# Patient Record
Sex: Female | Born: 1975 | Hispanic: No | Marital: Married | Smoking: Former smoker
Health system: Southern US, Community
[De-identification: ages and names within clinical notes are randomized; demographics above are authoritative.]

## PROBLEM LIST (undated history)

## (undated) DIAGNOSIS — I639 Cerebral infarction, unspecified: Secondary | ICD-10-CM

## (undated) DIAGNOSIS — E785 Hyperlipidemia, unspecified: Secondary | ICD-10-CM

## (undated) DIAGNOSIS — E119 Type 2 diabetes mellitus without complications: Secondary | ICD-10-CM

## (undated) DIAGNOSIS — R87619 Unspecified abnormal cytological findings in specimens from cervix uteri: Secondary | ICD-10-CM

## (undated) DIAGNOSIS — C801 Malignant (primary) neoplasm, unspecified: Secondary | ICD-10-CM

## (undated) DIAGNOSIS — B019 Varicella without complication: Secondary | ICD-10-CM

## (undated) DIAGNOSIS — N83209 Unspecified ovarian cyst, unspecified side: Secondary | ICD-10-CM

## (undated) HISTORY — DX: Hyperlipidemia, unspecified: E78.5

## (undated) HISTORY — DX: Cerebral infarction, unspecified: I63.9

## (undated) HISTORY — DX: Malignant (primary) neoplasm, unspecified: C80.1

## (undated) HISTORY — PX: COSMETIC SURGERY: SHX468

## (undated) HISTORY — DX: Varicella without complication: B01.9

## (undated) HISTORY — PX: ABDOMINAL HYSTERECTOMY: SHX81

## (undated) HISTORY — DX: Type 2 diabetes mellitus without complications: E11.9

---

## 2012-10-26 HISTORY — PX: SHOULDER SURGERY: SHX246

## 2013-01-19 ENCOUNTER — Emergency Department: Payer: Self-pay | Admitting: Emergency Medicine

## 2013-01-31 ENCOUNTER — Ambulatory Visit: Payer: Self-pay | Admitting: Orthopedic Surgery

## 2013-03-15 ENCOUNTER — Ambulatory Visit: Payer: Self-pay | Admitting: Orthopedic Surgery

## 2013-03-15 LAB — URINALYSIS, COMPLETE
Glucose,UR: NEGATIVE mg/dL (ref 0–75)
Ketone: NEGATIVE
Protein: NEGATIVE
RBC,UR: 6 /HPF (ref 0–5)
Squamous Epithelial: 1
WBC UR: 1 /HPF (ref 0–5)

## 2013-03-15 LAB — BASIC METABOLIC PANEL
Anion Gap: 7 (ref 7–16)
Calcium, Total: 9 mg/dL (ref 8.5–10.1)
Chloride: 104 mmol/L (ref 98–107)
Creatinine: 0.65 mg/dL (ref 0.60–1.30)
Osmolality: 270 (ref 275–301)
Potassium: 4.1 mmol/L (ref 3.5–5.1)
Sodium: 134 mmol/L — ABNORMAL LOW (ref 136–145)

## 2013-03-15 LAB — CBC
HCT: 36.9 % (ref 35.0–47.0)
HGB: 12.5 g/dL (ref 12.0–16.0)
MCH: 32.7 pg (ref 26.0–34.0)
Platelet: 362 10*3/uL (ref 150–440)
RDW: 12.3 % (ref 11.5–14.5)
WBC: 15.3 10*3/uL — ABNORMAL HIGH (ref 3.6–11.0)

## 2013-03-15 LAB — APTT: Activated PTT: 27.9 secs (ref 23.6–35.9)

## 2013-03-15 LAB — PROTIME-INR: Prothrombin Time: 12.7 secs (ref 11.5–14.7)

## 2013-03-29 ENCOUNTER — Ambulatory Visit: Payer: Self-pay | Admitting: Orthopedic Surgery

## 2015-02-15 NOTE — Op Note (Signed)
PATIENT NAME:  Olivia Wall, Olivia Wall MR#:  [id number] DATE OF BIRTH:  [birth year]  DATE OF PROCEDURE:  03/29/2013  PREOPERATIVE DIAGNOSIS: Right shoulder superior labral anterior posterior (SLAP) tear and anterior inferior labral (Bankart) tear.   POSTOPERATIVE DIAGNOSIS: Right shoulder superior labral anterior posterior (SLAP) tear and anterior inferior labral (Bankart) tear.   PROCEDURE: Right shoulder arthroscopic SLAP and Bankart repairs.   ANESTHESIA: General with right interscalene block.   ESTIMATED BLOOD LOSS: Minimal.   COMPLICATIONS: None.   SURGEON: Thornton Park, M.D.   IMPLANTS: Ar[employer]rex Bio-SutureTak anchors x 5.   INDICATION FOR THE PROCEDURE: The patient is a 39 year old female who was seen in the Emergency Room on 01/19/2013 after an acute dislocation of the right shoulder. 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 The patient had an MR arthrogram which demonstrated an anterior inferior labral tear as well as a superior labral tear. The patient decided to proceed with surgery, as she was unhappy with the persistent instability she was experiencing. I reviewed the details of the procedure as well as the risks and benefits of surgery with the patient in my office prior to the date of surgery. She understood that the risks of surgery would include infection, bleeding, nerve or blood vessel injury, shoulder stiffness, re- tear of the cartilage, hardware failure, persistent pain, persistent instability and redislocation of the right shoulder. The patient understood that she may require further surgery if she experienced any complications. The patient understands the medical complications include DVT and pulmonary embolism, myocardial infarction, stroke, pneumonia, respiratory failure and death. The patient signed the consent form and wished to proceed with surgery.   DESCRIPTION OF PROCEDURE: The patient was met in the preoperative area. I signed the  right shoulder within the operative field according to the hospital's right site protocol. This was after verbally confirming with the patient that this was the correct site of surgery. I also confirmed this with my office notes and radiographic studies. I updated the patient's history and physical. The patient was brought to the Operating Room where she underwent a right interscalene block by the anesthesia service. She then underwent general endotracheal intubation and was positioned in a beach chair position.   Examination under anesthesia demonstrated full active range of motion. The shoulder could be subluxed to the glenoid rim, but IT could not be frankly dislocated on exam. She had no posterior instability and a negative sulcus sign.   The patient was prepped and draped in a sterile fashion. A timeout was performed to verify the patient's name, date of birth, medical record number, correct site of surgery and correct procedure to be performed. It was also used to verify the patient had received antibiotics and that all appropriate instruments, implants and radiographic studies were available in the room. Once all in attendance were in agreement, the case began.   The patient's bony landmarks were drawn out with a surgical marker along with the proposed arthroscopy incisions. These were pre-injected with 1% lidocaine plain. A #11 blade was used to establish a posterior portal. An 18-gauge spinal needle was then used to help localize the correct position for the anterior portal. A full diagnostic examination of the glenohumeral joint was undertaken. Findings on arthroscopy included a superior labral anterior posterior tear and a Bankart tear were both confirmed. The patient had intact biceps and rotator cuff. She had a non-engaging Hill-Sachs. There were no loose bodies and the subscapularis tendon was intact as well.  There were no chondral lesions of the glenoid or humeral head surfaces.   An  anterolateral portal was then established, again using an 18-gauge spinal needle for localization. A 7.25 mm arthroscopic cannula was then placed through the anterolateral portal. A 5.75 mm arthroscopic cannula was placed through the anterior portal. An arthroscopic elevator was placed through the anterolateral portal and used to mobilize the anterior inferior labrum and  capsular complex. A 4.0 resector shaver blade was then used to debride the nonarticular surface of the bony glenoid until punctate bleeding was identified. A drill guide was then placed at the inferior aspect of the anterior inferior labral tear at the articular margin. A drill hole was made and a single Bio-SutureTak anchor was then malleted into position. Traction was pulled back on the suture tails of the Bio-SutureTak anchor to ensure that it was firmly set in the glenoid bone. A 90 degree suture lasso was then placed underneath the labrum and used to shuttle a single arm of the Bio-SutureTak anchor under the labrum. An arthroscopic knot was then tied, creating a bumper of the capsular labral complex at the site of the suture anchor. Two additional anchors were then placed above this to ensure repair of the anterior labrum with appropriate tensioning of the capsule and the inferior glenohumeral ligament. Following placement of 3 anterior inferior Bio-SutureTak anchors, the attention was then turned to the superior labrum. Again, a 4.0 resector shaver blade was used to debride the superior nonarticular surface of the bony glenoid until punctate bleeding was identified. Again, a drill sleeve was placed just anterior to the biceps and a drill hole was made, allowing for placement of a Bio-SutureTak anchor. Again using the same technique, a 90 degree suture lasso was brought around the superior labrum. It was retrieved on the glenoid side of the labrum and used to pass a single limb of the suture anchor underneath the labrum. Again, an arthroscopic  knot tying technique was used to stabilize and approximate the superior labrum to the bony glenoid. Similarly, a second anchor was placed just posterior to the biceps anchor, allowing for stabilization of the SLAP lesion using the above-noted technique. Final arthroscopic images were taken of both the superior and anterior inferior labral repairs. The shoulder joint was copiously irrigated and all arthroscopic instruments were removed. A 4-0 nylon suture was used to close the 3 arthroscopic portals. A dry sterile dressing was applied along with TENS unit leads, a Polar Care sleeve and an abduction sling. The patient was then awoken and transferred to a hospital stretcher where she was brought to the PACU in stable condition. I was scrubbed and present for the entire case and all sharp and instrument counts were correct at the conclusion of the case. I spoke with the patient's family postoperatively in the consultation room to let them know that Ms. Olivia Wall was stable in the recovery room and the case had gone without complication.    ____________________________ Timoteo Gaul, MD [id number] D: 04/03/2013 16:57:46 ET T: 04/03/2013 17:19:17 ET JOB#: [id number]  cc: Timoteo Gaul, MD, <Dictator> Timoteo Gaul MD ELECTRONICALLY SIGNED 04/18/2013 18:16

## 2016-03-05 ENCOUNTER — Encounter: Payer: Self-pay | Admitting: *Deleted

## 2016-03-05 ENCOUNTER — Inpatient Hospital Stay: Admission: RE | Admit: 2016-03-05 | Payer: Self-pay | Source: Ambulatory Visit

## 2016-03-05 NOTE — Patient Instructions (Signed)
  Your procedure is scheduled on: 03-13-16 (FRIDAY) Report to [facility] To find out your arrival time please call [phone] between 1PM - 3PM on 03-12-16 (THURSDAY)  Remember: Instructions that are not followed completely may result in serious medical risk, up to and including death, or upon the discretion of your surgeon and anesthesiologist your surgery may need to be rescheduled.    _X___ 1. Do not eat food or drink liquids after midnight. No gum chewing or hard candies.     _X___ 2. No Alcohol for 24 hours before or after surgery.   ____ 3. Bring all medications with you on the day of surgery if instructed.    _X___ 4. Notify your doctor if there is any change in your medical condition     (cold, fever, infections).     Do not wear jewelry, make-up, hairpins, clips or nail polish.  Do not wear lotions, powders, or perfumes. You may wear deodorant.  Do not shave 48 hours prior to surgery. Men may shave face and neck.  Do not bring valuables to the hospital.    [facility] is not responsible for any belongings or valuables.               Contacts, dentures or bridgework may not be worn into surgery.  Leave your suitcase in the car. After surgery it may be brought to your room.  For patients admitted to the hospital, discharge time is determined by your  treatment team.   Patients discharged the day of surgery will not be allowed to drive home.   Please read over the following fact sheets that you were given:      ____ Take these medicines the morning of surgery with A SIP OF WATER:    1. NONE  2.   3.   4.  5.  6.  ____ Fleet Enema (as directed)   ____ Use CHG Soap as directed  ____ Use inhalers on the day of surgery  ____ Stop metformin 2 days prior to surgery    ____ Take 1/2 of usual insulin dose the night before surgery and none on the morning of surgery.   ____ Stop Coumadin/Plavix/aspirin-N/A  _X___ Stop  Anti-inflammatories-NO NSAIDS OR ASPIRIN PRODUCTS-TYLENOL OK TO TAKE   ____ Stop supplements until after surgery.    ____ Bring C-Pap to the hospital.

## 2016-03-11 ENCOUNTER — Encounter
Admission: RE | Admit: 2016-03-11 | Discharge: 2016-03-11 | Disposition: A | Discharge status: Home / Self Care | Payer: 59 | Source: Ambulatory Visit | Attending: Obstetrics & Gynecology | Admitting: Obstetrics & Gynecology

## 2016-03-11 DIAGNOSIS — N870 Mild cervical dysplasia: Secondary | ICD-10-CM | POA: Diagnosis present

## 2016-03-11 DIAGNOSIS — Z87891 Personal history of nicotine dependence: Secondary | ICD-10-CM | POA: Diagnosis not present

## 2016-03-11 DIAGNOSIS — Z79899 Other long term (current) drug therapy: Secondary | ICD-10-CM | POA: Diagnosis not present

## 2016-03-11 DIAGNOSIS — D060 Carcinoma in situ of endocervix: Secondary | ICD-10-CM | POA: Diagnosis not present

## 2016-03-11 DIAGNOSIS — Z820 Family history of epilepsy and other diseases of the nervous system: Secondary | ICD-10-CM | POA: Diagnosis not present

## 2016-03-11 DIAGNOSIS — Z803 Family history of malignant neoplasm of breast: Secondary | ICD-10-CM | POA: Diagnosis not present

## 2016-03-11 LAB — BASIC METABOLIC PANEL
ANION GAP: 7 (ref 5–15)
BUN: 11 mg/dL (ref 6–20)
CALCIUM: 9.6 mg/dL (ref 8.9–10.3)
CO2: 26 mmol/L (ref 22–32)
CREATININE: 0.69 mg/dL (ref 0.44–1.00)
Chloride: 102 mmol/L (ref 101–111)
GFR calc non Af Amer: >60 mL/min (ref >60)
Glucose, Bld: 132 mg/dL — ABNORMAL HIGH (ref 65–99)
Potassium: 4 mmol/L (ref 3.5–5.1)
Sodium: 135 mmol/L (ref 135–145)

## 2016-03-11 LAB — CBC
HCT: 37.8 % (ref 35.0–47.0)
Hemoglobin: 12.8 g/dL (ref 12.0–16.0)
MCH: 33 pg (ref 26.0–34.0)
MCHC: 33.8 g/dL (ref 32.0–36.0)
MCV: 97.7 fL (ref 80.0–100.0)
PLATELETS: 326 10*3/uL (ref 150–440)
RBC: 3.87 MIL/uL (ref 3.80–5.20)
RDW: 12.5 % (ref 11.5–14.5)
WBC: 10.1 10*3/uL (ref 3.6–11.0)

## 2016-03-11 LAB — TYPE AND SCREEN
ABO/RH(D): O POS
Antibody Screen: NEGATIVE

## 2016-03-11 LAB — ABO/RH: ABO/RH(D): O POS

## 2016-03-13 ENCOUNTER — Encounter
Admission: RE | Disposition: A | Discharge status: Home / Self Care | Payer: Self-pay | Source: Ambulatory Visit | Attending: Obstetrics & Gynecology

## 2016-03-13 ENCOUNTER — Ambulatory Visit: Payer: 59 | Admitting: Anesthesiology

## 2016-03-13 ENCOUNTER — Ambulatory Visit
Admission: RE | Admit: 2016-03-13 | Discharge: 2016-03-13 | Disposition: A | Discharge status: Home / Self Care | Payer: 59 | Source: Ambulatory Visit | Attending: Obstetrics & Gynecology | Admitting: Obstetrics & Gynecology

## 2016-03-13 ENCOUNTER — Encounter: Payer: Self-pay | Admitting: *Deleted

## 2016-03-13 DIAGNOSIS — Z820 Family history of epilepsy and other diseases of the nervous system: Secondary | ICD-10-CM | POA: Insufficient documentation

## 2016-03-13 DIAGNOSIS — D060 Carcinoma in situ of endocervix: Secondary | ICD-10-CM | POA: Diagnosis not present

## 2016-03-13 DIAGNOSIS — Z87891 Personal history of nicotine dependence: Secondary | ICD-10-CM | POA: Insufficient documentation

## 2016-03-13 DIAGNOSIS — Z803 Family history of malignant neoplasm of breast: Secondary | ICD-10-CM | POA: Insufficient documentation

## 2016-03-13 DIAGNOSIS — Z79899 Other long term (current) drug therapy: Secondary | ICD-10-CM | POA: Insufficient documentation

## 2016-03-13 HISTORY — DX: Unspecified ovarian cyst, unspecified side: N83.209

## 2016-03-13 HISTORY — DX: Unspecified abnormal cytological findings in specimens from cervix uteri: R87.619

## 2016-03-13 HISTORY — PX: LEEP: SHX91

## 2016-03-13 LAB — POCT PREGNANCY, URINE: PREG TEST UR: NEGATIVE

## 2016-03-13 SURGERY — LEEP (LOOP ELECTROSURGICAL EXCISION PROCEDURE)
Anesthesia: General | Wound class: Contaminated

## 2016-03-13 MED ORDER — DEXAMETHASONE SODIUM PHOSPHATE 10 MG/ML IJ SOLN
INTRAMUSCULAR | Status: DC | PRN
  Administered 2016-03-13: 5 mg via INTRAVENOUS

## 2016-03-13 MED ORDER — FERRIC SUBSULFATE 259 MG/GM EX SOLN
CUTANEOUS | Status: AC
  Filled 2016-03-13: qty 8

## 2016-03-13 MED ORDER — KETOROLAC TROMETHAMINE 30 MG/ML IJ SOLN: 30.0000 mg | Freq: Four times a day (QID) | INTRAMUSCULAR | Status: DC

## 2016-03-13 MED ORDER — LIDOCAINE HCL (CARDIAC) 20 MG/ML IV SOLN
INTRAVENOUS | Status: DC | PRN
  Administered 2016-03-13: 100 mg via INTRAVENOUS

## 2016-03-13 MED ORDER — GLYCOPYRROLATE 0.2 MG/ML IJ SOLN
INTRAMUSCULAR | Status: DC | PRN
  Administered 2016-03-13: 0.2 mg via INTRAVENOUS

## 2016-03-13 MED ORDER — ONDANSETRON HCL 4 MG/2ML IJ SOLN: 4.0000 mg | Freq: Once | INTRAMUSCULAR | Status: DC | PRN

## 2016-03-13 MED ORDER — ONDANSETRON HCL 4 MG/2ML IJ SOLN
INTRAMUSCULAR | Status: DC | PRN
  Administered 2016-03-13: 4 mg via INTRAVENOUS

## 2016-03-13 MED ORDER — MIDAZOLAM HCL 5 MG/5ML IJ SOLN
INTRAMUSCULAR | Status: DC | PRN
  Administered 2016-03-13: 2 mg via INTRAVENOUS

## 2016-03-13 MED ORDER — IODINE STRONG (LUGOLS) 5 % PO SOLN
ORAL | Status: DC | PRN
  Administered 2016-03-13: 0.2 mL via ORAL

## 2016-03-13 MED ORDER — ACETAMINOPHEN 325 MG PO TABS: 650.0000 mg | ORAL_TABLET | ORAL | Status: DC | PRN

## 2016-03-13 MED ORDER — FAMOTIDINE 20 MG PO TABS
20.0000 mg | ORAL_TABLET | Freq: Once | ORAL | Status: AC
  Administered 2016-03-13: 20 mg via ORAL

## 2016-03-13 MED ORDER — LIDOCAINE HCL (PF) 1 % IJ SOLN
INTRAMUSCULAR | Status: AC
  Filled 2016-03-13: qty 30

## 2016-03-13 MED ORDER — FERRIC SUBSULFATE 259 MG/GM EX SOLN
CUTANEOUS | Status: DC | PRN
  Administered 2016-03-13: 1

## 2016-03-13 MED ORDER — FENTANYL CITRATE (PF) 100 MCG/2ML IJ SOLN: 25.0000 ug | INTRAMUSCULAR | Status: DC | PRN

## 2016-03-13 MED ORDER — MORPHINE SULFATE (PF) 2 MG/ML IV SOLN: 1.0000 mg | INTRAVENOUS | Status: DC | PRN

## 2016-03-13 MED ORDER — PROPOFOL 10 MG/ML IV BOLUS
INTRAVENOUS | Status: DC | PRN
  Administered 2016-03-13: 150 mg via INTRAVENOUS

## 2016-03-13 MED ORDER — FAMOTIDINE 20 MG PO TABS
ORAL_TABLET | ORAL | Status: AC
  Filled 2016-03-13: qty 1

## 2016-03-13 MED ORDER — KETOROLAC TROMETHAMINE 30 MG/ML IJ SOLN
30.0000 mg | Freq: Four times a day (QID) | INTRAMUSCULAR | Status: DC
  Filled 2016-03-13 (×6): qty 1

## 2016-03-13 MED ORDER — ACETAMINOPHEN 650 MG RE SUPP: 650.0000 mg | RECTAL | Status: DC | PRN

## 2016-03-13 MED ORDER — FENTANYL CITRATE (PF) 100 MCG/2ML IJ SOLN
INTRAMUSCULAR | Status: DC | PRN
  Administered 2016-03-13 (×2): 25 ug via INTRAVENOUS
  Administered 2016-03-13: 50 ug via INTRAVENOUS

## 2016-03-13 MED ORDER — IODINE STRONG (LUGOLS) 5 % PO SOLN
ORAL | Status: AC
  Filled 2016-03-13: qty 1

## 2016-03-13 MED ORDER — LACTATED RINGERS IV SOLN
INTRAVENOUS | Status: DC
Start: 2016-03-13 — End: 2016-03-13
  Administered 2016-03-13: 14:00:00 via INTRAVENOUS

## 2016-03-13 SURGICAL SUPPLY — 33 items
APPLICATOR COTTON TIP 6IN STRL (MISCELLANEOUS) ×9 IMPLANT
BLADE SURG SZ11 CARB STEEL (BLADE) ×3 IMPLANT
CANISTER SUCT 1200ML W/VALVE (MISCELLANEOUS) ×3 IMPLANT
CATH ROBINSON RED A/P 16FR (CATHETERS) ×3 IMPLANT
CNTNR SPEC 2.5X3XGRAD LEK (MISCELLANEOUS) ×1
CONT SPEC 4OZ STER OR WHT (MISCELLANEOUS) ×2
CONTAINER SPEC 2.5X3XGRAD LEK (MISCELLANEOUS) ×1 IMPLANT
DRAPE PERI LITHO V/GYN (MISCELLANEOUS) ×3 IMPLANT
DRAPE UNDER BUTTOCK W/FLU (DRAPES) ×3 IMPLANT
ELECT LEEP BALL 5MM 12CM (MISCELLANEOUS) ×3
ELECT LEEP LOOP 20X10 R2010 (MISCELLANEOUS) ×3
ELECT LOOP 1.0X1.0CM R1010 (MISCELLANEOUS) ×3
ELECT REM PT RETURN 9FT ADLT (ELECTROSURGICAL) ×3
ELECTRODE LEEP BALL 5MM 12CM (MISCELLANEOUS) ×1 IMPLANT
ELECTRODE LEP LOOP 20X10 R2010 (MISCELLANEOUS) ×1 IMPLANT
ELECTRODE LOOP 1.0X1.0CM R1010 (MISCELLANEOUS) ×1 IMPLANT
ELECTRODE REM PT RTRN 9FT ADLT (ELECTROSURGICAL) ×1 IMPLANT
GLOVE BIOGEL PI IND STRL 6.5 (GLOVE) ×1 IMPLANT
GLOVE BIOGEL PI INDICATOR 6.5 (GLOVE) ×2
GLOVE SURG SYN 6.5 ES PF (GLOVE) ×3 IMPLANT
GOWN STRL REUS W/ TWL LRG LVL3 (GOWN DISPOSABLE) ×2 IMPLANT
GOWN STRL REUS W/TWL LRG LVL3 (GOWN DISPOSABLE) ×4
KIT RM TURNOVER CYSTO AR (KITS) ×3 IMPLANT
NEEDLE SPNL 22GX3.5 QUINCKE BK (NEEDLE) ×3 IMPLANT
NS IRRIG 500ML POUR BTL (IV SOLUTION) ×3 IMPLANT
PACK BASIN MINOR ARMC (MISCELLANEOUS) ×3 IMPLANT
PAD OB MATERNITY 4.3X12.25 (PERSONAL CARE ITEMS) ×3 IMPLANT
PAD PREP 24X41 OB/GYN DISP (PERSONAL CARE ITEMS) ×3 IMPLANT
STRAW SMOKE EVAC LEEP 6150 NON (MISCELLANEOUS) ×3 IMPLANT
SYR CONTROL 10ML (SYRINGE) ×3 IMPLANT
TOWEL OR 17X26 4PK STRL BLUE (TOWEL DISPOSABLE) ×3 IMPLANT
TUBING CONNECTING 10 (TUBING) ×2 IMPLANT
TUBING CONNECTING 10' (TUBING) ×1

## 2016-03-13 NOTE — Transfer of Care (Signed)
Immediate Anesthesia Transfer of Care Note  Patient: Olivia Wall  Procedure(s) Performed: Procedure(s): LOOP ELECTROSURGICAL EXCISION PROCEDURE (LEEP) (N/A)  Patient Location: PACU  Anesthesia Type:General  Level of Consciousness: sedated  Airway & Oxygen Therapy: Patient Spontanous Breathing and Patient connected to face mask oxygen  Post-op Assessment: Report given to RN  Post vital signs: Reviewed and stable  Last Vitals:  Filed Vitals:   03/13/16 1321 03/13/16 1637  BP: 125/75 119/80  Pulse: 79 88  Temp: 36.9 C 36.2 C  Resp: 14 19    Last Pain: There were no vitals filed for this visit.       Complications: No apparent anesthesia complications

## 2016-03-13 NOTE — Anesthesia Preprocedure Evaluation (Signed)
Anesthesia Evaluation  Patient identified by MRN, date of birth, ID band Patient awake    Reviewed: Allergy & Precautions, H&P , NPO status , Patient's Chart, lab work & pertinent test results, reviewed documented beta blocker date and time   Airway Mallampati: II  TM Distance: >3 FB Neck ROM: full    Dental  (+) Teeth Intact   Pulmonary neg pulmonary ROS, former smoker,    Pulmonary exam normal        Cardiovascular Exercise Tolerance: Good negative cardio ROS Normal cardiovascular exam Rate:Normal     Neuro/Psych negative neurological ROS  negative psych ROS   GI/Hepatic negative GI ROS, Neg liver ROS,   Endo/Other  negative endocrine ROS  Renal/GU negative Renal ROS  negative genitourinary   Musculoskeletal   Abdominal   Peds  Hematology negative hematology ROS (+)   Anesthesia Other Findings   Reproductive/Obstetrics negative OB ROS                             Anesthesia Physical Anesthesia Plan  ASA: II  Anesthesia Plan: General LMA   Post-op Pain Management:    Induction:   Airway Management Planned:   Additional Equipment:   Intra-op Plan:   Post-operative Plan:   Informed Consent: I have reviewed the patients History and Physical, chart, labs and discussed the procedure including the risks, benefits and alternatives for the proposed anesthesia with the patient or authorized representative who has indicated his/her understanding and acceptance.     Plan Discussed with: CRNA  Anesthesia Plan Comments:         Anesthesia Quick Evaluation  

## 2016-03-13 NOTE — H&P (Signed)
H&P Update  PLEASE SEE PAPER H&P  Pt was last seen in my office, and complete history and physical performed.  The surgical history has been reviewed and remains accurate without interval change. The patient was re-examined and patient's physiologic condition has not changed significantly in the last 30 days.  No new pharmacological allergies or types of therapy has been initiated.  No Known Allergies  Past Medical History  Diagnosis Date  . Abnormal Pap smear of cervix   . Ovarian cyst    Past Surgical History  Procedure Laterality Date  . Cesarean section    . Shoulder surgery Right     BP 125/75 mmHg  Pulse 79  Temp(Src) 98.4 F (36.9 C) (Oral)  Resp 14  Ht 5\' 6"  (1.676 m)  Wt 97.07 kg (214 lb)  BMI 34.56 kg/m2  SpO2 100%  LMP 03/05/2013  NAD RRR no murmurs CTAB, no wheezing, resps unlabored +BS, soft, NTTP No c/c/e Pelvic exam deferred  The above history was confirmed with the patient. The condition still exists that makes this procedure necessary. Surgical plan includes Loop Electrocautery Excision Procedure (LEEP), as confirmed on the consent. The treatment plan remains the same, without new options for care.  The patient understands the potential benefits and risks and the consents have been signed and placed on the chart.     Larey Days, MD Attending Obstetrician Gynecologist [facility]

## 2016-03-13 NOTE — Anesthesia Postprocedure Evaluation (Signed)
Anesthesia Post Note  Patient: Olivia Wall  Procedure(s) Performed: Procedure(s) (LRB): LOOP ELECTROSURGICAL EXCISION PROCEDURE (LEEP) (N/A)  Patient location during evaluation: PACU Anesthesia Type: General Level of consciousness: awake and alert Pain management: pain level controlled Vital Signs Assessment: post-procedure vital signs reviewed and stable Respiratory status: spontaneous breathing, nonlabored ventilation, respiratory function stable and patient connected to nasal cannula oxygen Cardiovascular status: blood pressure returned to baseline and stable Postop Assessment: no signs of nausea or vomiting Anesthetic complications: no    Last Vitals:  Filed Vitals:   03/13/16 1706 03/13/16 1718  BP: 114/78 116/74  Pulse: 63 83  Temp: 36.6 C 36.7 C  Resp: 17 19    Last Pain:  Filed Vitals:   03/13/16 1721  PainSc: 0-No pain                 Benedetto Ryder S

## 2016-03-13 NOTE — Anesthesia Procedure Notes (Signed)
Procedure Name: LMA Insertion Date/Time: 03/13/2016 4:07 PM Performed by: Dionne Bucy Pre-anesthesia Checklist: Patient identified, Emergency Drugs available, Suction available and Patient being monitored Patient Re-evaluated:Patient Re-evaluated prior to inductionOxygen Delivery Method: Circle system utilized Preoxygenation: Pre-oxygenation with 100% oxygen Intubation Type: IV induction Ventilation: Mask ventilation without difficulty LMA: LMA inserted LMA Size: 3.5 Number of attempts: 1 Placement Confirmation: positive ETCO2 and breath sounds checked- equal and bilateral Tube secured with: Tape Dental Injury: Teeth and Oropharynx as per pre-operative assessment

## 2016-03-13 NOTE — Op Note (Signed)
  LEEP PROCEDURE NOTE   PROCEDURE DATE: 03/13/2016  PATIENT:  Olivia Wall  40 y.o. female  PRE-OPERATIVE DIAGNOSIS:  Cervical dysplasia  POST-OPERATIVE DIAGNOSIS:  Cervical dysplasia  PROCEDURE:  Procedure(s): LOOP ELECTROSURGICAL EXCISION PROCEDURE (LEEP) (N/A)  SURGEON:  Surgeon(s) and Role:    * Emeril Stille C Heyli Min, MD - Primary  ANESTHESIA:  General via LMA  I/O  Total I/O In: -  Out: 5 [Blood:5]  FINDINGS:  Widespread lack of uptake of Lugol's solution with most extensive being at 6:00, circumferentially.  SPECIMEN: 1. LEEP cone biopsy, stitch at 12:00.  2. LEEP Right border of cervix.  3. LEEP Left border of cervix, 4. 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  Role of HPV,cervical dysplasia and need for close followup was empasized. Informed written consent was obtained. All questions were answered. Time out performed. Urine pregnancy test was negative.  Procedure: The patient was placed in lithotomy position and the bivalved coated speculum was placed in the patient's vagina. A grounding pad placed on the patient. Lugol's solution was applied to the cervix and areas of decreased uptake were noted around the transformation zone.  The suction was turned on and the large loop on 60 Watts of cutting current was used to excise the area of decreased uptake and excise the entire transformation zone.  Excellent hemostasis was achieved using roller ball coagulation set at 50 Watts coagulation current.  Astringyn solution was then applied and the speculum was removed from the vagina. Specimens were sent to pathology.  All instruments were removed.  Instrument, sponge and needle counts were correct x2.  The patient tolerated the procedure and was brought to PACU in a stable condition.   Post-operative instructions given to patient, including instruction to seek medical attention for persistent bright red bleeding, fever, abdominal/pelvic pain, dysuria, nausea or vomiting. She was also told about the possibility of having black tinged discharge for weeks. She was counseled to avoid anything in the vagina (sex/douching/tampons) for 4 weeks. She has a 4 week post-operative check to assess wound healing, review results and discuss further management.   ----- Larey Days, MD Attending Obstetrician and Gynecologist [facility]

## 2016-03-13 NOTE — Discharge Instructions (Signed)
You should expect to have some cramping and vaginal bleeding for about a week. This should taper off and subside, much like a period. If heavy bleeding continues or gets worse, you should contact the office for an earlier appointment.   Please call the office or physician on call for fever >101, severe pain, and heavy bleeding.   Herrings!!   AMBULATORY SURGERY  DISCHARGE INSTRUCTIONS   1) The drugs that you were given will stay in your system until tomorrow so for the next 24 hours you should not:  A) Drive an automobile B) Make any legal decisions C) Drink any alcoholic beverage   2) You may resume regular meals tomorrow.  Today it is better to start with liquids and gradually work up to solid foods.  You may eat anything you prefer, but it is better to start with liquids, then soup and crackers, and gradually work up to solid foods.   3) Please notify your doctor immediately if you have any unusual bleeding, trouble breathing, redness and pain at the surgery site, drainage, fever, or pain not relieved by medication.    4) Additional Instructions:        Please contact your physician with any problems or Same Day Surgery at [phone], Monday through Friday 6 am to 4 pm, or [place] at [facility] number at [phone].AMBULATORY SURGERY  DISCHARGE INSTRUCTIONS   5) The drugs that you were given will stay in your system until tomorrow so for the next 24 hours you should not:  D) Drive an automobile E) Make any legal decisions F) Drink any alcoholic beverage   6) You may resume regular meals tomorrow.  Today it is better to start with liquids and gradually work up to solid foods.  You may eat anything you prefer, but it is better to start with liquids, then soup and crackers, and gradually work up to solid foods.   7) Please notify your doctor immediately if you have any unusual bleeding, trouble breathing, redness and  pain at the surgery site, drainage, fever, or pain not relieved by medication.    8) Additional Instructions:        Please contact your physician with any problems or Same Day Surgery at [phone], Monday through Friday 6 am to 4 pm, or [place] at [facility] number at [phone].

## 2016-03-16 ENCOUNTER — Encounter: Payer: Self-pay | Admitting: Obstetrics & Gynecology

## 2016-03-17 LAB — SURGICAL PATHOLOGY

## 2016-04-24 ENCOUNTER — Encounter
Admission: RE | Admit: 2016-04-24 | Discharge: 2016-04-24 | Disposition: A | Discharge status: Home / Self Care | Payer: 59 | Source: Ambulatory Visit | Attending: Obstetrics & Gynecology | Admitting: Obstetrics & Gynecology

## 2016-04-24 DIAGNOSIS — Z01812 Encounter for preprocedural laboratory examination: Secondary | ICD-10-CM | POA: Insufficient documentation

## 2016-04-24 LAB — CBC
HCT: 37.6 % (ref 35.0–47.0)
HEMOGLOBIN: 12.8 g/dL (ref 12.0–16.0)
MCH: 33.3 pg (ref 26.0–34.0)
MCHC: 34.2 g/dL (ref 32.0–36.0)
MCV: 97.4 fL (ref 80.0–100.0)
Platelets: 319 10*3/uL (ref 150–440)
RBC: 3.86 MIL/uL (ref 3.80–5.20)
RDW: 12.7 % (ref 11.5–14.5)
WBC: 10.1 10*3/uL (ref 3.6–11.0)

## 2016-04-24 LAB — BASIC METABOLIC PANEL
Anion gap: 8 (ref 5–15)
BUN: 13 mg/dL (ref 6–20)
CHLORIDE: 103 mmol/L (ref 101–111)
CO2: 27 mmol/L (ref 22–32)
Calcium: 9.5 mg/dL (ref 8.9–10.3)
Creatinine, Ser: 0.71 mg/dL (ref 0.44–1.00)
GFR calc Af Amer: >60 mL/min (ref >60)
GFR calc non Af Amer: >60 mL/min (ref >60)
GLUCOSE: 121 mg/dL — AB (ref 65–99)
POTASSIUM: 3.6 mmol/L (ref 3.5–5.1)
Sodium: 138 mmol/L (ref 135–145)

## 2016-04-24 NOTE — Patient Instructions (Signed)
  Your procedure is scheduled on: May 07, 2016  (Thursday) Report to Same Day Surgery 2nd floor [facility] To find out your arrival time please call [phone] between 1PM - 3PM on  May 06, 2016 (Wednesday) Remember: Instructions that are not followed completely may result in serious medical risk, up to and including death, or upon the discretion of your surgeon and anesthesiologist your surgery may need to be rescheduled.    _x___ 1. Do not eat food or drink liquids after midnight. No gum chewing or hard candies.     __x__ 2. No Alcohol for 24 hours before or after surgery.   __x__3. No Smoking for 24 prior to surgery.   ____  4. Bring all medications with you on the day of surgery if instructed.    __x__ 5. Notify your doctor if there is any change in your medical condition     (cold, fever, infections).     Do not wear jewelry, make-up, hairpins, clips or nail polish.  Do not wear lotions, powders, or perfumes. You may wear deodorant.  Do not shave 48 hours prior to surgery. Men may shave face and neck.  Do not bring valuables to the hospital.    [facility] is not responsible for any belongings or valuables.               Contacts, dentures or bridgework may not be worn into surgery.  Leave your suitcase in the car. After surgery it may be brought to your room.  For patients admitted to the hospital, discharge time is determined by your treatment team.   Patients discharged the day of surgery will not be allowed to drive home.    Please read over the following fact sheets that you were given:   [facility] Preparing for Surgery and or MRSA Information   ___ Take these medicines the morning of surgery with A SIP OF WATER:    1.   2.  3.  4.  5.  6.  ____ Fleet Enema (as directed)   _x___ Use CHG Soap or sage wipes as directed on instruction sheet   ____ Use inhalers on the day of surgery and bring to hospital day of surgery  ____ Stop metformin 2 days prior to  surgery    ____ Take 1/2 of usual insulin dose the night before surgery and none on the morning of surgery           __x__ Stop aspirin or coumadin, or plavix (NO ASPIRIN )I  _x__ Stop Anti-inflammatories such as Advil, Aleve, Ibuprofen, Motrin, Naproxen,          Naprosyn, Goodies powders or aspirin products. Ok to take Tylenol.   ____ Stop supplements until after surgery.    ____ Bring C-Pap to the hospital.

## 2016-05-07 ENCOUNTER — Encounter: Payer: Self-pay | Admitting: *Deleted

## 2016-05-07 ENCOUNTER — Ambulatory Visit
Admission: RE | Admit: 2016-05-07 | Discharge: 2016-05-07 | Disposition: A | Discharge status: Home / Self Care | Payer: 59 | Source: Ambulatory Visit | Attending: Obstetrics & Gynecology | Admitting: Obstetrics & Gynecology

## 2016-05-07 ENCOUNTER — Encounter
Admission: RE | Disposition: A | Discharge status: Home / Self Care | Payer: Self-pay | Source: Ambulatory Visit | Attending: Obstetrics & Gynecology

## 2016-05-07 ENCOUNTER — Ambulatory Visit: Payer: 59 | Admitting: Anesthesiology

## 2016-05-07 DIAGNOSIS — Z793 Long term (current) use of hormonal contraceptives: Secondary | ICD-10-CM | POA: Insufficient documentation

## 2016-05-07 DIAGNOSIS — N711 Chronic inflammatory disease of uterus: Secondary | ICD-10-CM | POA: Insufficient documentation

## 2016-05-07 DIAGNOSIS — Z9889 Other specified postprocedural states: Secondary | ICD-10-CM | POA: Diagnosis not present

## 2016-05-07 DIAGNOSIS — Z803 Family history of malignant neoplasm of breast: Secondary | ICD-10-CM | POA: Insufficient documentation

## 2016-05-07 DIAGNOSIS — N879 Dysplasia of cervix uteri, unspecified: Secondary | ICD-10-CM | POA: Diagnosis present

## 2016-05-07 DIAGNOSIS — Z87891 Personal history of nicotine dependence: Secondary | ICD-10-CM | POA: Insufficient documentation

## 2016-05-07 DIAGNOSIS — N870 Mild cervical dysplasia: Secondary | ICD-10-CM | POA: Insufficient documentation

## 2016-05-07 DIAGNOSIS — Z820 Family history of epilepsy and other diseases of the nervous system: Secondary | ICD-10-CM | POA: Insufficient documentation

## 2016-05-07 DIAGNOSIS — N838 Other noninflammatory disorders of ovary, fallopian tube and broad ligament: Secondary | ICD-10-CM | POA: Insufficient documentation

## 2016-05-07 DIAGNOSIS — N72 Inflammatory disease of cervix uteri: Secondary | ICD-10-CM | POA: Diagnosis not present

## 2016-05-07 HISTORY — PX: LAPAROSCOPIC HYSTERECTOMY: SHX1926

## 2016-05-07 HISTORY — PX: LAPAROSCOPIC BILATERAL SALPINGECTOMY: SHX5889

## 2016-05-07 LAB — TYPE AND SCREEN
ABO/RH(D): O POS
Antibody Screen: NEGATIVE

## 2016-05-07 LAB — POCT PREGNANCY, URINE: PREG TEST UR: NEGATIVE

## 2016-05-07 SURGERY — HYSTERECTOMY, TOTAL, LAPAROSCOPIC
Anesthesia: General

## 2016-05-07 MED ORDER — ACETAMINOPHEN 650 MG RE SUPP
650.0000 mg | RECTAL | Status: DC | PRN
  Filled 2016-05-07: qty 1

## 2016-05-07 MED ORDER — LIDOCAINE HCL (CARDIAC) 20 MG/ML IV SOLN
INTRAVENOUS | Status: DC | PRN
  Administered 2016-05-07: 45 mg via INTRAVENOUS

## 2016-05-07 MED ORDER — FENTANYL CITRATE (PF) 100 MCG/2ML IJ SOLN
INTRAMUSCULAR | Status: DC
Start: 2016-05-07 — End: 2016-05-07
  Filled 2016-05-07: qty 2

## 2016-05-07 MED ORDER — FENTANYL CITRATE (PF) 100 MCG/2ML IJ SOLN
INTRAMUSCULAR | Status: AC
  Filled 2016-05-07: qty 2

## 2016-05-07 MED ORDER — ACETAMINOPHEN 325 MG PO TABS: 650.0000 mg | ORAL_TABLET | ORAL | Status: DC | PRN

## 2016-05-07 MED ORDER — FENTANYL CITRATE (PF) 100 MCG/2ML IJ SOLN
25.0000 ug | INTRAMUSCULAR | Status: AC | PRN
  Administered 2016-05-07 (×2): 25 ug via INTRAVENOUS

## 2016-05-07 MED ORDER — CEFAZOLIN SODIUM-DEXTROSE 2-4 GM/100ML-% IV SOLN
INTRAVENOUS | Status: AC
  Administered 2016-05-07: 2 g via INTRAVENOUS
  Filled 2016-05-07: qty 100

## 2016-05-07 MED ORDER — DEXAMETHASONE SODIUM PHOSPHATE 10 MG/ML IJ SOLN
INTRAMUSCULAR | Status: DC | PRN
  Administered 2016-05-07: 10 mg via INTRAVENOUS

## 2016-05-07 MED ORDER — FENTANYL CITRATE (PF) 100 MCG/2ML IJ SOLN
25.0000 ug | INTRAMUSCULAR | Status: AC | PRN
  Administered 2016-05-07 (×6): 25 ug via INTRAVENOUS

## 2016-05-07 MED ORDER — HEPARIN SODIUM (PORCINE) 5000 UNIT/ML IJ SOLN
5000.0000 [IU] | Freq: Once | INTRAMUSCULAR | Status: AC
  Administered 2016-05-07: 5000 [IU] via SUBCUTANEOUS

## 2016-05-07 MED ORDER — ROCURONIUM BROMIDE 100 MG/10ML IV SOLN
INTRAVENOUS | Status: DC | PRN
  Administered 2016-05-07 (×2): 10 mg via INTRAVENOUS
  Administered 2016-05-07: 20 mg via INTRAVENOUS

## 2016-05-07 MED ORDER — LACTATED RINGERS IV SOLN
INTRAVENOUS | Status: DC
  Administered 2016-05-07: 10:00:00 via INTRAVENOUS

## 2016-05-07 MED ORDER — SUCCINYLCHOLINE CHLORIDE 20 MG/ML IJ SOLN
INTRAMUSCULAR | Status: DC | PRN
  Administered 2016-05-07: 120 mg via INTRAVENOUS

## 2016-05-07 MED ORDER — KETOROLAC TROMETHAMINE 30 MG/ML IJ SOLN
INTRAMUSCULAR | Status: DC | PRN
  Administered 2016-05-07: 30 mg via INTRAVENOUS

## 2016-05-07 MED ORDER — PROPOFOL 10 MG/ML IV BOLUS
INTRAVENOUS | Status: DC | PRN
  Administered 2016-05-07: 170 mg via INTRAVENOUS

## 2016-05-07 MED ORDER — FENTANYL CITRATE (PF) 100 MCG/2ML IJ SOLN
INTRAMUSCULAR | Status: DC | PRN
  Administered 2016-05-07 (×2): 100 ug via INTRAVENOUS
  Administered 2016-05-07: 50 ug via INTRAVENOUS

## 2016-05-07 MED ORDER — CEFAZOLIN SODIUM-DEXTROSE 2-4 GM/100ML-% IV SOLN
2.0000 g | Freq: Once | INTRAVENOUS | Status: AC
  Administered 2016-05-07: 2 g via INTRAVENOUS

## 2016-05-07 MED ORDER — MIDAZOLAM HCL 5 MG/5ML IJ SOLN
INTRAMUSCULAR | Status: DC | PRN
  Administered 2016-05-07: 2 mg via INTRAVENOUS

## 2016-05-07 MED ORDER — MORPHINE SULFATE (PF) 2 MG/ML IV SOLN
1.0000 mg | INTRAVENOUS | Status: DC | PRN
  Administered 2016-05-07 (×2): 1 mg via INTRAVENOUS

## 2016-05-07 MED ORDER — HEPARIN SODIUM (PORCINE) 5000 UNIT/ML IJ SOLN
INTRAMUSCULAR | Status: AC
  Administered 2016-05-07: 5000 [IU] via SUBCUTANEOUS
  Filled 2016-05-07: qty 1

## 2016-05-07 MED ORDER — FAMOTIDINE 20 MG PO TABS
20.0000 mg | ORAL_TABLET | Freq: Once | ORAL | Status: AC
  Administered 2016-05-07: 20 mg via ORAL

## 2016-05-07 MED ORDER — LACTATED RINGERS IV SOLN
INTRAVENOUS | Status: DC | PRN
  Administered 2016-05-07: 11:00:00 via INTRAVENOUS

## 2016-05-07 MED ORDER — MORPHINE SULFATE (PF) 2 MG/ML IV SOLN
INTRAVENOUS | Status: AC
  Filled 2016-05-07: qty 1

## 2016-05-07 MED ORDER — GLYCOPYRROLATE 0.2 MG/ML IJ SOLN
INTRAMUSCULAR | Status: DC | PRN
  Administered 2016-05-07: 0.2 mg via INTRAVENOUS

## 2016-05-07 MED ORDER — ONDANSETRON HCL 4 MG/2ML IJ SOLN: 4.0000 mg | Freq: Once | INTRAMUSCULAR | Status: DC | PRN

## 2016-05-07 MED ORDER — FAMOTIDINE 20 MG PO TABS
ORAL_TABLET | ORAL | Status: AC
Start: 2016-05-07 — End: 2016-05-07
  Administered 2016-05-07: 20 mg via ORAL
  Filled 2016-05-07: qty 1

## 2016-05-07 MED ORDER — ONDANSETRON HCL 4 MG/2ML IJ SOLN
INTRAMUSCULAR | Status: DC | PRN
Start: 2016-05-07 — End: 2016-05-07
  Administered 2016-05-07: 4 mg via INTRAVENOUS

## 2016-05-07 MED ORDER — SUGAMMADEX SODIUM 200 MG/2ML IV SOLN
INTRAVENOUS | Status: DC | PRN
  Administered 2016-05-07: 200 mg via INTRAVENOUS

## 2016-05-07 MED ORDER — PHENYLEPHRINE HCL 10 MG/ML IJ SOLN
INTRAMUSCULAR | Status: DC | PRN
  Administered 2016-05-07: 100 ug via INTRAVENOUS

## 2016-05-07 SURGICAL SUPPLY — 64 items
BACTOSHIELD CHG 4% 4OZ (MISCELLANEOUS) ×2
BAG URO DRAIN 2000ML W/SPOUT (MISCELLANEOUS) ×4 IMPLANT
BLADE SURG SZ11 CARB STEEL (BLADE) ×4 IMPLANT
CANISTER SUCT 1200ML W/VALVE (MISCELLANEOUS) ×4 IMPLANT
CATH FOLEY 2WAY  5CC 16FR (CATHETERS) ×2
CATH ROBINSON RED A/P 16FR (CATHETERS) ×4 IMPLANT
CATH URTH 16FR FL 2W BLN LF (CATHETERS) ×2 IMPLANT
CHLORAPREP W/TINT 26ML (MISCELLANEOUS) ×8 IMPLANT
DEFOGGER SCOPE WARMER CLEARIFY (MISCELLANEOUS) ×4 IMPLANT
DEVICE SUTURE ENDOST 10MM (ENDOMECHANICALS) IMPLANT
DRAPE LEGGINS SURG 28X43 STRL (DRAPES) ×4 IMPLANT
DRAPE SHEET LG 3/4 BI-LAMINATE (DRAPES) ×4 IMPLANT
DRAPE UNDER BUTTOCK W/FLU (DRAPES) ×4 IMPLANT
DRESSING TELFA 4X3 1S ST N-ADH (GAUZE/BANDAGES/DRESSINGS) ×12 IMPLANT
DRSG TEGADERM 2-3/8X2-3/4 SM (GAUZE/BANDAGES/DRESSINGS) ×8 IMPLANT
ENDOPOUCH RETRIEVER 10 (MISCELLANEOUS) IMPLANT
GAUZE SPONGE NON-WVN 2X2 STRL (MISCELLANEOUS) ×4 IMPLANT
GLOVE PI ORTHOPRO 6.5 (GLOVE) ×2
GLOVE PI ORTHOPRO STRL 6.5 (GLOVE) ×2 IMPLANT
GLOVE SURG SYN 6.5 ES PF (GLOVE) ×12 IMPLANT
GOWN STRL REUS W/ TWL LRG LVL3 (GOWN DISPOSABLE) ×6 IMPLANT
GOWN STRL REUS W/ TWL XL LVL3 (GOWN DISPOSABLE) ×2 IMPLANT
GOWN STRL REUS W/TWL LRG LVL3 (GOWN DISPOSABLE) ×6
GOWN STRL REUS W/TWL XL LVL3 (GOWN DISPOSABLE) ×2
GRASPER SUT TROCAR 14GX15 (MISCELLANEOUS) ×4 IMPLANT
IRRIGATION STRYKERFLOW (MISCELLANEOUS) ×2 IMPLANT
IRRIGATOR STRYKERFLOW (MISCELLANEOUS) ×4
IV LACTATED RINGERS 1000ML (IV SOLUTION) ×4 IMPLANT
KIT PINK PAD W/HEAD ARE REST (MISCELLANEOUS) ×4
KIT PINK PAD W/HEAD ARM REST (MISCELLANEOUS) ×2 IMPLANT
KIT RM TURNOVER CYSTO AR (KITS) ×4 IMPLANT
L-HOOK LAP DISP 36CM (ELECTROSURGICAL) ×4
LABEL OR SOLS (LABEL) IMPLANT
LHOOK LAP DISP 36CM (ELECTROSURGICAL) ×2 IMPLANT
LIGASURE BLUNT 5MM 37CM (INSTRUMENTS) ×4 IMPLANT
LIQUID BAND (GAUZE/BANDAGES/DRESSINGS) ×4 IMPLANT
MANIPULATOR UTERINE 4.5 ZUMI (MISCELLANEOUS) IMPLANT
MANIPULATOR VCARE LG CRV RETR (MISCELLANEOUS) IMPLANT
MANIPULATOR VCARE SML CRV RETR (MISCELLANEOUS) IMPLANT
MANIPULATOR VCARE STD CRV RETR (MISCELLANEOUS) ×4 IMPLANT
NEEDLE VERESS 14GA 120MM (NEEDLE) ×4 IMPLANT
NS IRRIG 500ML POUR BTL (IV SOLUTION) ×4 IMPLANT
PACK LAP CHOLECYSTECTOMY (MISCELLANEOUS) ×4 IMPLANT
PAD OB MATERNITY 4.3X12.25 (PERSONAL CARE ITEMS) ×4 IMPLANT
PAD PREP 24X41 OB/GYN DISP (PERSONAL CARE ITEMS) ×4 IMPLANT
PENCIL ELECTRO HAND CTR (MISCELLANEOUS) ×4 IMPLANT
SCISSORS METZENBAUM CVD 33 (INSTRUMENTS) IMPLANT
SCRUB CHG 4% DYNA-HEX 4OZ (MISCELLANEOUS) ×2 IMPLANT
SET CYSTO W/LG BORE CLAMP LF (SET/KITS/TRAYS/PACK) IMPLANT
SLEEVE ENDOPATH XCEL 5M (ENDOMECHANICALS) ×8 IMPLANT
SPONGE VERSALON 2X2 STRL (MISCELLANEOUS) ×4
SPONGE XRAY 4X4 16PLY STRL (MISCELLANEOUS) ×4 IMPLANT
SURGILUBE 2OZ TUBE FLIPTOP (MISCELLANEOUS) ×4 IMPLANT
SUT ENDO VLOC 180-0-8IN (SUTURE) IMPLANT
SUT MNCRL AB 4-0 PS2 18 (SUTURE) ×4 IMPLANT
SUT VIC AB 0 CT1 36 (SUTURE) ×8 IMPLANT
SUT VIC AB 2-0 UR6 27 (SUTURE) ×4 IMPLANT
SUT VIC AB 4-0 PS2 18 (SUTURE) ×4 IMPLANT
SYRINGE 10CC LL (SYRINGE) ×4 IMPLANT
TROCAR BLUNT TIP 12MM OMST12BT (TROCAR) ×4 IMPLANT
TROCAR ENDO BLADELESS 11MM (ENDOMECHANICALS) ×4 IMPLANT
TROCAR XCEL NON-BLD 5MMX100MML (ENDOMECHANICALS) ×4 IMPLANT
TUBING INSUFFLATOR HEATED (MISCELLANEOUS) ×4 IMPLANT
TUBING INSUFFLATOR HI FLOW (MISCELLANEOUS) ×4 IMPLANT

## 2016-05-07 NOTE — Transfer of Care (Signed)
Immediate Anesthesia Transfer of Care Note  Patient: Olivia Wall  Procedure(s) Performed: Procedure(s): HYSTERECTOMY TOTAL LAPAROSCOPIC (N/A) LAPAROSCOPIC BILATERAL SALPINGECTOMY (Bilateral)  Patient Location: PACU  Anesthesia Type:General  Level of Consciousness: sedated  Airway & Oxygen Therapy: Patient Spontanous Breathing and Patient connected to face mask oxygen  Post-op Assessment: Report given to RN  Post vital signs: Reviewed and stable  Last Vitals:  Filed Vitals:   05/07/16 0953 05/07/16 1257  BP: 115/81 125/59  Pulse: 70 76  Temp: 36.8 C 36.1 C  Resp: 17 15    Last Pain: There were no vitals filed for this visit.       Complications: No apparent anesthesia complications

## 2016-05-07 NOTE — Discharge Instructions (Signed)
Discharge instructions:  Call office if you have any of the following: fever >101 F, chills, excessive vaginal bleeding, incision drainage or problems, leg pain or redness, or any other concerns.   Activity: Do not lift > 10 lbs for 8 weeks.  No intercourse or tampons for 8 weeks.  No driving for 1-2 weeks.   Please don't limit yourself in terms of routine activity.  You will be able to do most things, although they may take longer to do or be a little painful.  You can do it!  Don't be a hero, but don't be a wimp either! AMBULATORY SURGERY  DISCHARGE INSTRUCTIONS   1) The drugs that you were given will stay in your system until tomorrow so for the next 24 hours you should not:  A) Drive an automobile B) Make any legal decisions C) Drink any alcoholic beverage   2) You may resume regular meals tomorrow.  Today it is better to start with liquids and gradually work up to solid foods.  You may eat anything you prefer, but it is better to start with liquids, then soup and crackers, and gradually work up to solid foods.   3) Please notify your doctor immediately if you have any unusual bleeding, trouble breathing, redness and pain at the surgery site, drainage, fever, or pain not relieved by medication.    4) Additional Instructions:        Please contact your physician with any problems or Same Day Surgery at [phone], Monday through Friday 6 am to 4 pm, or [place] at [facility] number at [phone].

## 2016-05-07 NOTE — OR Nursing (Signed)
Pt transferred to bedside commode to void, unable to.  Pt dressed for dc home.  Abdominal pain up to 4/10, ot requested pain med.

## 2016-05-07 NOTE — H&P (Signed)
H&P Update  PLEASE SEE PAPER H&P  Pt was last seen in my office, and complete history and physical performed.  The surgical history has been reviewed and remains accurate without interval change. The patient was re-examined and patient's physiologic condition has not changed significantly in the last 30 days.  No new pharmacological allergies or types of therapy has been initiated.  No Known Allergies  Past Medical History  Diagnosis Date  . Abnormal Pap smear of cervix   . Ovarian cyst    Past Surgical History  Procedure Laterality Date  . Cesarean section  1994  . Shoulder surgery Right 2014  . Leep N/A 03/13/2016    Procedure: LOOP ELECTROSURGICAL EXCISION PROCEDURE (LEEP);  Surgeon: Honor Loh Ward, MD;  Location: [facility] ORS;  Service: Gynecology;  Laterality: N/A;    BP 115/81 mmHg  Pulse 70  Temp(Src) 98.3 F (36.8 C) (Oral)  Resp 17  Ht 5\' 6"  (1.676 m)  Wt 96.163 kg (212 lb)  BMI 34.23 kg/m2  SpO2 98%  NAD RRR no murmurs CTAB, no wheezing, resps unlabored +BS, soft, NTTP No c/c/e Pelvic exam deferred  The above history was confirmed with the patient. The condition still exists that makes this procedure necessary. Surgical plan includes total laparoscopic hysterectomy, bilateral salpingectomy, as confirmed on the consent. The treatment plan remains the same, without new options for care.  The patient understands the potential benefits and risks and the consents have been signed and placed on the chart.     Larey Days, MD Attending Obstetrician Gynecologist [facility]

## 2016-05-07 NOTE — Anesthesia Preprocedure Evaluation (Signed)
Anesthesia Evaluation  Patient identified by MRN, date of birth, ID band Patient awake    Reviewed: Allergy & Precautions, NPO status , Patient's Chart, lab work & pertinent test results, reviewed documented beta blocker date and time   Airway Mallampati: III  TM Distance: >3 FB     Dental  (+) Chipped   Pulmonary former smoker,           Cardiovascular      Neuro/Psych    GI/Hepatic   Endo/Other    Renal/GU      Musculoskeletal   Abdominal   Peds  Hematology   Anesthesia Other Findings Obese.  Reproductive/Obstetrics                             Anesthesia Physical Anesthesia Plan  ASA: III  Anesthesia Plan: General   Post-op Pain Management:    Induction: Intravenous  Airway Management Planned: Oral ETT  Additional Equipment:   Intra-op Plan:   Post-operative Plan:   Informed Consent: I have reviewed the patients History and Physical, chart, labs and discussed the procedure including the risks, benefits and alternatives for the proposed anesthesia with the patient or authorized representative who has indicated his/her understanding and acceptance.     Plan Discussed with: CRNA  Anesthesia Plan Comments:         Anesthesia Quick Evaluation

## 2016-05-07 NOTE — Anesthesia Postprocedure Evaluation (Signed)
Anesthesia Post Note  Patient: Olivia Wall  Procedure(s) Performed: Procedure(s) (LRB): HYSTERECTOMY TOTAL LAPAROSCOPIC (N/A) LAPAROSCOPIC BILATERAL SALPINGECTOMY (Bilateral)  Patient location during evaluation: PACU Anesthesia Type: General Level of consciousness: awake and alert Pain management: pain level controlled Vital Signs Assessment: post-procedure vital signs reviewed and stable Respiratory status: spontaneous breathing, nonlabored ventilation, respiratory function stable and patient connected to nasal cannula oxygen Cardiovascular status: blood pressure returned to baseline and stable Postop Assessment: no signs of nausea or vomiting Anesthetic complications: no    Last Vitals:  Filed Vitals:   05/07/16 1615 05/07/16 1625  BP:    Pulse: 63 65  Temp:    Resp:      Last Pain:  Filed Vitals:   05/07/16 1629  PainSc: [place]

## 2016-05-07 NOTE — Anesthesia Procedure Notes (Signed)
Procedure Name: Intubation Date/Time: 05/07/2016 11:07 AM Performed by: Dionne Bucy Pre-anesthesia Checklist: Patient identified, Patient being monitored, Timeout performed, Emergency Drugs available and Suction available Patient Re-evaluated:Patient Re-evaluated prior to inductionOxygen Delivery Method: Circle system utilized Preoxygenation: Pre-oxygenation with 100% oxygen Intubation Type: IV induction Ventilation: Mask ventilation without difficulty Laryngoscope Size: Mac and 3 Grade View: Grade II Tube type: Oral Tube size: 7.0 mm Number of attempts: 1 Airway Equipment and Method: Stylet (pt ramped up with blankets; blankets removed after intubation) Placement Confirmation: ETT inserted through vocal cords under direct vision,  positive ETCO2 and breath sounds checked- equal and bilateral Secured at: 21 cm Tube secured with: Tape Dental Injury: Teeth and Oropharynx as per pre-operative assessment

## 2016-05-08 ENCOUNTER — Encounter: Payer: Self-pay | Admitting: Obstetrics & Gynecology

## 2016-05-08 LAB — SURGICAL PATHOLOGY

## 2016-05-15 NOTE — Op Note (Signed)
Total Laparoscopic Hysterectomy Operative Note Procedure Date: 05/07/2016  Patient:  Olivia Wall  40 y.o. female  PRE-OPERATIVE DIAGNOSIS:  CERVICAL DYSPLASIA  POST-OPERATIVE DIAGNOSIS:  CERVICAL DYSPLASIA  PROCEDURE:  Procedure(s): HYSTERECTOMY TOTAL LAPAROSCOPIC (N/A) LAPAROSCOPIC BILATERAL SALPINGECTOMY (Bilateral)  SURGEON:  Surgeon(s) and Role:    * Daion Ginsberg C Jaqualin Serpa, MD - Primary    * Malachy Mood, MD - Assisting  ANESTHESIA:  General via ET  I/O   [id number] ebl 50  FINDINGS:  Small uterus, normal ovaries and fallopian tubes bilaterally.  Adhesions of omentum to upper abdominal wall.  SPECIMEN: Uterus, Cervix, and bilateral fallopian tubes  COMPLICATIONS: none apparent  DISPOSITION: vital signs stable to PACU  Indication for Surgery: 40 y.o. who has a history of cervical dysplasia (CIN 3/CIS) and who requested definitive management of this plus sterilization.  Risks of surgery were discussed with the patient including but not limited to: bleeding which may require transfusion or reoperation; infection which may require antibiotics; injury to bowel, bladder, ureters or other surrounding organs; need for additional procedures including laparotomy, blood clot, incisional problems and other postoperative/anesthesia complications. Written informed consent was obtained.      PROCEDURE IN DETAIL:  The patient had 5000u Heparin Sub-q and sequential compression devices applied to her lower extremities while in the preoperative area.  She was then taken to the operating room where general anesthesia was administered via endotracheal route.  She was placed in the dorsal lithotomy position, and was prepped and draped in a sterile manner. A surgical time-out was performed.  A Foley catheter was inserted into her bladder and attached to constant drainage and a V-Care uterine manipulator was then advanced into the uterus and a good fit around the cervix was noted. The gloves were  changed, and attention was turned to the abdomen where an umbilical incision was made with the scalpel.  A 29mm trochar was inserted in the umbilical incision using a visiport method.Opening pressure was 2mmHg, and the abdomen was insufflated to 15mg Hg carbon dioxide gas and adequate pneumoperitoneum was obtained A survey of the patient's pelvis and abdomen revealed the findings as mentioned above. Two 47mm ports were inserted in the lower left and right quadrants under visualization.  Lysis of adhesions of the omentum was performed using the Ligasure.  Visualization was achieved without difficulty or damage to abdominal organs.  The bilateral fallopian tubes were separated from the mesosalpinx using the Ligasure. The bilateral round ligaments were transected and anterior broad ligament divided and brought across the uterus to separate the vesicouterine peritoneum and create a bladder flap. The bladder was pushed away from the uterus. The bilateral uterine arteries were ligated and transected. The bilateral uterosacral and cardinal ligaments were ligated and transected. A colpotomy was made around the V-Care cervical cup and the uterus, cervix, and bilateral tubes were removed through the vagina. The vaginal cuff was closed vaginally using 0-Vicryl in a running locking stitch. This was tested for integrity using the surgeon's finger. After a change of gloves, the pneumoperitoneum was recreated and surgical site inspected, and found to be hemostatic. Bilateral ureters were visualized vermiuclating. No intraoperative injury to surrounding organs was noted. The abdomen was desufflated and all instruments were then removed.   All skin incisions were closed with 4-0 monocryl and covered with surgical glue. The patient tolerated the procedures well.  All instruments, needles, and sponge counts were correct x 2. The patient was taken to the recovery room in stable condition.   ---- Larey Days,  MD Attending Obstetrician and Gynecologist [facility]

## 2018-06-14 ENCOUNTER — Other Ambulatory Visit: Payer: Self-pay | Admitting: Obstetrics & Gynecology

## 2018-06-14 DIAGNOSIS — Z1231 Encounter for screening mammogram for malignant neoplasm of breast: Secondary | ICD-10-CM

## 2018-07-12 ENCOUNTER — Ambulatory Visit
Admission: RE | Admit: 2018-07-12 | Discharge: 2018-07-12 | Disposition: A | Discharge status: Home / Self Care | Payer: Managed Care, Other (non HMO) | Source: Ambulatory Visit | Attending: Obstetrics & Gynecology | Admitting: Obstetrics & Gynecology

## 2018-07-12 ENCOUNTER — Encounter: Payer: Self-pay | Admitting: Radiology

## 2018-07-12 DIAGNOSIS — Z1231 Encounter for screening mammogram for malignant neoplasm of breast: Secondary | ICD-10-CM | POA: Insufficient documentation

## 2018-07-21 ENCOUNTER — Encounter: Payer: Self-pay | Admitting: Family Medicine

## 2018-07-21 ENCOUNTER — Ambulatory Visit: Payer: Managed Care, Other (non HMO) | Admitting: Family Medicine

## 2018-07-21 VITALS — BP 124/86 | HR 76 | Temp 98.8°F | Ht 65.0 in | Wt 213.8 lb

## 2018-07-21 DIAGNOSIS — E119 Type 2 diabetes mellitus without complications: Secondary | ICD-10-CM

## 2018-07-21 DIAGNOSIS — E559 Vitamin D deficiency, unspecified: Secondary | ICD-10-CM

## 2018-07-21 MED ORDER — BLOOD GLUCOSE METER KIT: PACK | 0 refills | Status: DC

## 2018-07-21 MED ORDER — METFORMIN HCL ER 500 MG PO TB24: 500.0000 mg | ORAL_TABLET | Freq: Every day | ORAL | 2 refills | Status: DC

## 2018-07-21 NOTE — Progress Notes (Signed)
Subjective:    Patient ID: Olivia Wall, female    DOB: 1976-09-01, 42 y.o.   MRN: [id number]  HPI   Patient presents to clinic to establish primary care.  Currently she takes no medications other than a vitamin D supplement.  Patient is past medical history, social history, family history, surgical history reviewed and updated in chart.  Mammogram is up-to-date, had this beginning of September 2019.  Patient follows regularly with GYN due to having multiple abnormal Pap smears, then having hysterectomy performed in 2017.  Patient's main concern today is her blood sugar levels.  She had lab work done through her work for [employer].  Her fasting blood sugar in the lab was 150, and her A1c was 6.6%.  These labs were done 2 weeks ago.  Patient states she did have gestational diabetes with her second pregnancy, had to take insulin and check blood sugars throughout the entire pregnancy.  Patient Active Problem List   Diagnosis Date Noted  . Diabetes mellitus without complication ([place]) 47/42/5956   Social History   Tobacco Use  . Smoking status: Former Smoker    Packs/day: 0.50    Years: 15.00    Pack years: 7.50    Types: Cigarettes    Last attempt to quit: 03/05/2012    Years since quitting: 6.3  . Smokeless tobacco: Never Used  Substance Use Topics  . Alcohol use: No   Past Surgical History:  Procedure Laterality Date  . ABDOMINAL HYSTERECTOMY    . CESAREAN SECTION  1994  . LAPAROSCOPIC BILATERAL SALPINGECTOMY Bilateral 05/07/2016   Procedure: LAPAROSCOPIC BILATERAL SALPINGECTOMY;  Surgeon: Honor Loh Ward, MD;  Location: [facility] ORS;  Service: Gynecology;  Laterality: Bilateral;  . LAPAROSCOPIC HYSTERECTOMY N/A 05/07/2016   Procedure: HYSTERECTOMY TOTAL LAPAROSCOPIC;  Surgeon: Honor Loh Ward, MD;  Location: [facility] ORS;  Service: Gynecology;  Laterality: N/A;  . LEEP N/A 03/13/2016   Procedure: LOOP ELECTROSURGICAL EXCISION PROCEDURE (LEEP);  Surgeon: Honor Loh Ward, MD;   Location: [facility] ORS;  Service: Gynecology;  Laterality: N/A;  . SHOULDER SURGERY Right 2014   Family History  Problem Relation Age of Onset  . Multiple sclerosis Mother   . Diabetes Maternal Grandmother   . Cancer Paternal Grandmother   . COPD Paternal Grandfather    Review of Systems  Constitutional: Negative for chills, fatigue and fever.  HENT: Negative for congestion, ear pain, sinus pain and sore throat.   Eyes: Negative.   Respiratory: Negative for cough, shortness of breath and wheezing.   Cardiovascular: Negative for chest pain, palpitations and leg swelling.  Gastrointestinal: Negative for abdominal pain, diarrhea, nausea and vomiting.  Genitourinary: Negative for dysuria, frequency and urgency.  Musculoskeletal: Negative for arthralgias and myalgias.  Skin: Negative for color change, pallor and rash.  Neurological: Negative for syncope, light-headedness and headaches.  Psychiatric/Behavioral: The patient is not nervous/anxious.       Objective:   Physical Exam  Constitutional: She appears well-developed and well-nourished. No distress.  Head: Normocephalic and atraumatic.  Eyes: Pupils are equal, round, and reactive to light. EOM are normal. No scleral icterus.  Neck: Normal range of motion. Neck supple. No tracheal deviation present.  Cardiovascular: Normal rate, regular rhythm and normal heart sounds.  Pulmonary/Chest: Effort normal and breath sounds normal. No respiratory distress. She has no wheezes. She has no rales.  Abdominal: Soft. Bowel sounds are normal. There is no tenderness.  Neurological: She is alert and oriented to person, place, and time.  Gait normal  Skin: Skin is warm and dry. No pallor.  Psychiatric: She has a normal mood and affect. Her behavior is normal. Thought content normal.  Nursing note and vitals reviewed.     Vitals:   07/21/18 1601  BP: 124/86  Pulse: 76  Temp: 98.8 F (37.1 C)  SpO2: 97%    Assessment & Plan:    Type 2  diabetes - A1c of 6.6% puts patient in the type 2 diabetes range.  We will begin with low-dose metformin XR 500 mg once daily.  Patient also given glucose monitor and will check blood sugars 1-2 times per day and keep a log of readings.  Discussed healthy diet and exercise to help keep blood sugars under good control.  Patient given handout outlining diabetic type diet.  Diabetes education declined at this time, patient states she does know how to use the meter and is aware of the type of diet she is supposed to eat due to her past experience with gestational diabetes.  Vit D deficiency -- Continue Vit D supplement. We will monitor Vit D levels with next lab work.   Patient will follow-up in approximately 4 weeks for recheck on how blood sugars are trending with addition of metformin.  Patient declines flu shot in clinic.

## 2018-07-21 NOTE — Patient Instructions (Signed)
Fasting AM blood sugar ideal goal is under 100  Blood sugar less than 70 is nearing the too low range   Diabetes Mellitus and Nutrition When you have diabetes (diabetes mellitus), it is very important to have healthy eating habits because your blood sugar (glucose) levels are greatly affected by what you eat and drink. Eating healthy foods in the appropriate amounts, at about the same times every day, can help you:  Control your blood glucose.  Lower your risk of heart disease.  Improve your blood pressure.  Reach or maintain a healthy weight.  Every person with diabetes is different, and each person has different needs for a meal plan. Your health care provider may recommend that you work with a diet and nutrition specialist (dietitian) to make a meal plan that is best for you. Your meal plan may vary depending on factors such as:  The calories you need.  The medicines you take.  Your weight.  Your blood glucose, blood pressure, and cholesterol levels.  Your activity level.  Other health conditions you have, such as heart or kidney disease.  How do carbohydrates affect me? Carbohydrates affect your blood glucose level more than any other type of food. Eating carbohydrates naturally increases the amount of glucose in your blood. Carbohydrate counting is a method for keeping track of how many carbohydrates you eat. Counting carbohydrates is important to keep your blood glucose at a healthy level, especially if you use insulin or take certain oral diabetes medicines. It is important to know how many carbohydrates you can safely have in each meal. This is different for every person. Your dietitian can help you calculate how many carbohydrates you should have at each meal and for snack. Foods that contain carbohydrates include:  Bread, cereal, rice, pasta, and crackers.  Potatoes and corn.  Peas, beans, and lentils.  Milk and yogurt.  Fruit and juice.  Desserts, such as  cakes, cookies, ice cream, and candy.  How does alcohol affect me? Alcohol can cause a sudden decrease in blood glucose (hypoglycemia), especially if you use insulin or take certain oral diabetes medicines. Hypoglycemia can be a life-threatening condition. Symptoms of hypoglycemia (sleepiness, dizziness, and confusion) are similar to symptoms of having too much alcohol. If your health care provider says that alcohol is safe for you, follow these guidelines:  Limit alcohol intake to no more than 1 drink per day for nonpregnant women and 2 drinks per day for men. One drink equals 12 oz of beer, 5 oz of wine, or 1 oz of hard liquor.  Do not drink on an empty stomach.  Keep yourself hydrated with water, diet soda, or unsweetened iced tea.  Keep in mind that regular soda, juice, and other mixers may contain a lot of sugar and must be counted as carbohydrates.  What are tips for following this plan? Reading food labels  Start by checking the serving size on the label. The amount of calories, carbohydrates, fats, and other nutrients listed on the label are based on one serving of the food. Many foods contain more than one serving per package.  Check the total grams (g) of carbohydrates in one serving. You can calculate the number of servings of carbohydrates in one serving by dividing the total carbohydrates by 15. For example, if a food has 30 g of total carbohydrates, it would be equal to 2 servings of carbohydrates.  Check the number of grams (g) of saturated and trans fats in one serving. Choose foods  that have low or no amount of these fats.  Check the number of milligrams (mg) of sodium in one serving. Most people should limit total sodium intake to less than 2,300 mg per day.  Always check the nutrition information of foods labeled as "low-fat" or "nonfat". These foods may be higher in added sugar or refined carbohydrates and should be avoided.  Talk to your dietitian to identify your  daily goals for nutrients listed on the label. Shopping  Avoid buying canned, premade, or processed foods. These foods tend to be high in fat, sodium, and added sugar.  Shop around the outside edge of the grocery store. This includes fresh fruits and vegetables, bulk grains, fresh meats, and fresh dairy. Cooking  Use low-heat cooking methods, such as baking, instead of high-heat cooking methods like deep frying.  Cook using healthy oils, such as olive, canola, or sunflower oil.  Avoid cooking with butter, cream, or high-fat meats. Meal planning  Eat meals and snacks regularly, preferably at the same times every day. Avoid going long periods of time without eating.  Eat foods high in fiber, such as fresh fruits, vegetables, beans, and whole grains. Talk to your dietitian about how many servings of carbohydrates you can eat at each meal.  Eat 4-6 ounces of lean protein each day, such as lean meat, chicken, fish, eggs, or tofu. 1 ounce is equal to 1 ounce of meat, chicken, or fish, 1 egg, or 1/4 cup of tofu.  Eat some foods each day that contain healthy fats, such as avocado, nuts, seeds, and fish. Lifestyle   Check your blood glucose regularly.  Exercise at least 30 minutes 5 or more days each week, or as told by your health care provider.  Take medicines as told by your health care provider.  Do not use any products that contain nicotine or tobacco, such as cigarettes and e-cigarettes. If you need help quitting, ask your health care provider.  Work with a Social worker or diabetes educator to identify strategies to manage stress and any emotional and social challenges. What are some questions to ask my health care provider?  Do I need to meet with a diabetes educator?  Do I need to meet with a dietitian?  What number can I call if I have questions?  When are the best times to check my blood glucose? Where to find more information:  American Diabetes Association:  diabetes.org/food-and-fitness/food  Academy of Nutrition and Dietetics: PokerClues.dk  [employer] of Diabetes and Digestive and Kidney Diseases (NIH): ContactWire.be Summary  A healthy meal plan will help you control your blood glucose and maintain a healthy lifestyle.  Working with a diet and nutrition specialist (dietitian) can help you make a meal plan that is best for you.  Keep in mind that carbohydrates and alcohol have immediate effects on your blood glucose levels. It is important to count carbohydrates and to use alcohol carefully. This information is not intended to replace advice given to you by your health care provider. Make sure you discuss any questions you have with your health care provider. Document Released: 07/09/2005 Document Revised: 11/16/2016 Document Reviewed: 11/16/2016 Elsevier Interactive Patient Education  [employer].

## 2018-07-22 DIAGNOSIS — E119 Type 2 diabetes mellitus without complications: Secondary | ICD-10-CM | POA: Insufficient documentation

## 2018-07-22 DIAGNOSIS — E559 Vitamin D deficiency, unspecified: Secondary | ICD-10-CM | POA: Insufficient documentation

## 2018-07-22 DIAGNOSIS — E1169 Type 2 diabetes mellitus with other specified complication: Secondary | ICD-10-CM | POA: Insufficient documentation

## 2018-08-23 ENCOUNTER — Ambulatory Visit: Payer: Managed Care, Other (non HMO) | Admitting: Family Medicine

## 2018-09-08 ENCOUNTER — Other Ambulatory Visit: Payer: Self-pay | Admitting: Family Medicine

## 2018-09-08 ENCOUNTER — Telehealth: Payer: Self-pay | Admitting: Lab

## 2018-09-08 DIAGNOSIS — E119 Type 2 diabetes mellitus without complications: Secondary | ICD-10-CM

## 2018-09-08 MED ORDER — METFORMIN HCL ER 500 MG PO TB24: 500.0000 mg | ORAL_TABLET | Freq: Every day | ORAL | 1 refills | Status: DC

## 2018-09-08 NOTE — Telephone Encounter (Signed)
Rx sent in  Please have her come in for follow up appt in 2-3 months

## 2018-09-08 NOTE — Telephone Encounter (Signed)
Called Pt no lest message on VM. Was calling about Pt  Rx being sent to the pharmacy , also to set up an appt with Lauren Guse in 2-3 months. Okay for [facility] to speak to Pt.

## 2018-09-08 NOTE — Telephone Encounter (Signed)
[facility] New Rx request  Metformin ER Tab

## 2019-06-30 ENCOUNTER — Other Ambulatory Visit: Payer: Self-pay

## 2019-06-30 ENCOUNTER — Encounter: Payer: Self-pay | Admitting: Family Medicine

## 2019-06-30 ENCOUNTER — Ambulatory Visit (INDEPENDENT_AMBULATORY_CARE_PROVIDER_SITE_OTHER): Payer: Managed Care, Other (non HMO) | Admitting: Family Medicine

## 2019-06-30 ENCOUNTER — Ambulatory Visit: Payer: Managed Care, Other (non HMO) | Admitting: Family Medicine

## 2019-06-30 VITALS — BP 112/78 | HR 71 | Temp 96.9°F | Resp 18 | Ht 65.5 in | Wt 204.6 lb

## 2019-06-30 DIAGNOSIS — Z0000 Encounter for general adult medical examination without abnormal findings: Secondary | ICD-10-CM

## 2019-06-30 DIAGNOSIS — E119 Type 2 diabetes mellitus without complications: Secondary | ICD-10-CM

## 2019-06-30 NOTE — Progress Notes (Signed)
Subjective:    Patient ID: Olivia Wall, female    DOB: Aug 22, 1976, 43 y.o.   MRN: [id number]  HPI   Patient presents to clinic for annual exam.  Also needs to follow-up on diabetes.  Overall patient is feeling well.  States she has not been taking her metformin every day, usually when she checks her blood sugar it ranges anywhere from 96-1 20.  Denies any readings over 130, denies hypoglycemia.  Patient wondering if she even needs to take metformin scheduled every day, seems to be able to control sugar readings with diet.  Patient does see dentist approximately every 6 months.  Has not seen an eye doctor in a little over a year, plans to make appointment.  Does see GYN annually for women's health exams.  Declines flu shot today.  Declines pneumonia vaccine today  Patient Active Problem List   Diagnosis Date Noted  . Diabetes mellitus without complication ([place]) 123456  . Vitamin D deficiency 07/22/2018   Social History   Tobacco Use  . Smoking status: Former Smoker    Packs/day: 0.50    Years: 15.00    Pack years: 7.50    Types: Cigarettes    Quit date: 03/05/2012    Years since quitting: 7.3  . Smokeless tobacco: Never Used  Substance Use Topics  . Alcohol use: No   Past Surgical History:  Procedure Laterality Date  . ABDOMINAL HYSTERECTOMY    . CESAREAN SECTION  1994  . LAPAROSCOPIC BILATERAL SALPINGECTOMY Bilateral 05/07/2016   Procedure: LAPAROSCOPIC BILATERAL SALPINGECTOMY;  Surgeon: Honor Loh Ward, MD;  Location: [facility];  Service: Gynecology;  Laterality: Bilateral;  . LAPAROSCOPIC HYSTERECTOMY N/A 05/07/2016   Procedure: HYSTERECTOMY TOTAL LAPAROSCOPIC;  Surgeon: Honor Loh Ward, MD;  Location: [facility];  Service: Gynecology;  Laterality: N/A;  . LEEP N/A 03/13/2016   Procedure: LOOP ELECTROSURGICAL EXCISION PROCEDURE (LEEP);  Surgeon: Honor Loh Ward, MD;  Location: [facility];  Service: Gynecology;  Laterality: N/A;  . SHOULDER SURGERY Right 2014   Family  History  Problem Relation Age of Onset  . Multiple sclerosis Mother   . Diabetes Maternal Grandmother   . Cancer Paternal Grandmother   . COPD Paternal Grandfather    Review of Systems  Constitutional: Negative for chills, fatigue and fever.  HENT: Negative for congestion, ear pain, sinus pain and sore throat.   Eyes: Negative.   Respiratory: Negative for cough, shortness of breath and wheezing.   Cardiovascular: Negative for chest pain, palpitations and leg swelling.  Gastrointestinal: Negative for abdominal pain, diarrhea, nausea and vomiting.  Genitourinary: Negative for dysuria, frequency and urgency.  Musculoskeletal: Negative for arthralgias and myalgias.  Skin: Negative for color change, pallor and rash.  Neurological: Negative for syncope, light-headedness and headaches.  Psychiatric/Behavioral: The patient is not nervous/anxious.       Objective:   Physical Exam Vitals signs and nursing note reviewed.  Constitutional:      General: She is not in acute distress.    Appearance: Normal appearance. She is not ill-appearing, toxic-appearing or diaphoretic.  HENT:     Head: Normocephalic and atraumatic.     Right Ear: Tympanic membrane, ear canal and external ear normal.     Left Ear: Tympanic membrane, ear canal and external ear normal.  Eyes:     General: No scleral icterus.    Extraocular Movements: Extraocular movements intact.     Conjunctiva/sclera: Conjunctivae normal.     Pupils: Pupils are equal, round, and reactive to  light.  Neck:     Musculoskeletal: Normal range of motion and neck supple. No neck rigidity or muscular tenderness.     Vascular: No carotid bruit.  Cardiovascular:     Rate and Rhythm: Normal rate and regular rhythm.     Pulses:          Dorsalis pedis pulses are 2+ on the right side and 2+ on the left side.       Posterior tibial pulses are 2+ on the right side and 2+ on the left side.     Heart sounds: Normal heart sounds.  Musculoskeletal:      Right lower leg: No edema.     Left lower leg: No edema.  Feet:     Right foot:     Protective Sensation: 4 sites tested. 4 sites sensed.     Skin integrity: Skin integrity normal.     Left foot:     Protective Sensation: 4 sites tested. 4 sites sensed.     Skin integrity: Skin integrity normal.  Skin:    General: Skin is warm and dry.     Capillary Refill: Capillary refill takes less than 2 seconds.     Coloration: Skin is not jaundiced or pale.  Neurological:     Mental Status: She is alert and oriented to person, place, and time.     Cranial Nerves: No cranial nerve deficit.     Gait: Gait normal.  Psychiatric:        Mood and Affect: Mood normal.        Behavior: Behavior normal.        Thought Content: Thought content normal.        Judgment: Judgment normal.     Vitals:   06/30/19 1516  BP: 112/78  Pulse: 71  Resp: 18  Temp: (!) 96.9 F (36.1 C)  SpO2: 98%   Body mass index is 33.53 kg/m.     Assessment & Plan:    Well adult exam-overall patient appears to be a healthy 43 year old female.  Women's health exams are up-to-date through her GYN.  We will get new blood work in clinic today.  Reviewed healthy diet and regular exercise, recommended diet high in lean proteins, lots of vegetables, various fruits and avoiding excess sugar processed carbohydrates.  Discussed good water intake.  Recommended wearing SPF of at least 30 when outdoors, recommend reapplying every 2 hours and also wearing a hat and long sleeves of going to be in sunlight for many hours.  Patient does see dentist regularly, encouraged her to make appointment with ophthalmologist.  Type 2 diabetes-we will follow-up with A1c in clinic to see where her diabetes numbers have been.  Discussed with patient that it is possible we can have her stop taking the metformin altogether and just do a diet controlled diabetes treatment approach.  Patient will follow-up annually for physical exam and will return  to clinic sooner if any issues arise.  Advised that we will contact her with lab work results and next steps in regards to her A1c level at that time.

## 2019-07-01 LAB — COMPREHENSIVE METABOLIC PANEL
ALT: 11 IU/L (ref 0–32)
AST: 13 IU/L (ref 0–40)
Albumin/Globulin Ratio: 1.5 (ref 1.2–2.2)
Albumin: 4.3 g/dL (ref 3.8–4.8)
Alkaline Phosphatase: 86 IU/L (ref 39–117)
BUN/Creatinine Ratio: 15 (ref 9–23)
BUN: 11 mg/dL (ref 6–24)
Bilirubin Total: <0.2 mg/dL (ref 0.0–1.2)
CO2: 24 mmol/L (ref 20–29)
Calcium: 9.9 mg/dL (ref 8.7–10.2)
Chloride: 100 mmol/L (ref 96–106)
Creatinine, Ser: 0.73 mg/dL (ref 0.57–1.00)
GFR calc Af Amer: 118 mL/min/{1.73_m2} (ref >59)
GFR calc non Af Amer: 103 mL/min/{1.73_m2} (ref >59)
Globulin, Total: 2.9 g/dL (ref 1.5–4.5)
Glucose: 109 mg/dL — ABNORMAL HIGH (ref 65–99)
Potassium: 3.8 mmol/L (ref 3.5–5.2)
Sodium: 137 mmol/L (ref 134–144)
Total Protein: 7.2 g/dL (ref 6.0–8.5)

## 2019-07-01 LAB — CBC WITH DIFFERENTIAL/PLATELET
Basophils Absolute: 0 10*3/uL (ref 0.0–0.2)
Basos: 0 %
EOS (ABSOLUTE): 0.1 10*3/uL (ref 0.0–0.4)
Eos: 1 %
Hematocrit: 36.8 % (ref 34.0–46.6)
Hemoglobin: 12.9 g/dL (ref 11.1–15.9)
Immature Grans (Abs): 0 10*3/uL (ref 0.0–0.1)
Immature Granulocytes: 0 %
Lymphocytes Absolute: 3.5 10*3/uL — ABNORMAL HIGH (ref 0.7–3.1)
Lymphs: 34 %
MCH: 33.8 pg — ABNORMAL HIGH (ref 26.6–33.0)
MCHC: 35.1 g/dL (ref 31.5–35.7)
MCV: 96 fL (ref 79–97)
Monocytes Absolute: 0.7 10*3/uL (ref 0.1–0.9)
Monocytes: 7 %
Neutrophils Absolute: 6.1 10*3/uL (ref 1.4–7.0)
Neutrophils: 58 %
Platelets: 361 10*3/uL (ref 150–450)
RBC: 3.82 x10E6/uL (ref 3.77–5.28)
RDW: 11.4 % — ABNORMAL LOW (ref 11.7–15.4)
WBC: 10.5 10*3/uL (ref 3.4–10.8)

## 2019-07-01 LAB — B12 AND FOLATE PANEL
Folate: >20 ng/mL (ref >3.0)
Vitamin B-12: 1650 pg/mL — ABNORMAL HIGH (ref 232–1245)

## 2019-07-01 LAB — VITAMIN D 25 HYDROXY (VIT D DEFICIENCY, FRACTURES): Vit D, 25-Hydroxy: 30.7 ng/mL (ref 30.0–100.0)

## 2019-07-01 LAB — HEMOGLOBIN A1C
Est. average glucose Bld gHb Est-mCnc: 131 mg/dL
Hgb A1c MFr Bld: 6.2 % — ABNORMAL HIGH (ref 4.8–5.6)

## 2019-07-01 LAB — TSH: TSH: 1.08 u[IU]/mL (ref 0.450–4.500)

## 2019-07-11 ENCOUNTER — Other Ambulatory Visit: Payer: Self-pay | Admitting: Obstetrics & Gynecology

## 2019-07-11 DIAGNOSIS — Z1231 Encounter for screening mammogram for malignant neoplasm of breast: Secondary | ICD-10-CM

## 2019-07-11 LAB — HM PAP SMEAR

## 2019-08-07 ENCOUNTER — Ambulatory Visit: Payer: Managed Care, Other (non HMO) | Admitting: Family Medicine

## 2019-08-07 ENCOUNTER — Ambulatory Visit (INDEPENDENT_AMBULATORY_CARE_PROVIDER_SITE_OTHER): Payer: Managed Care, Other (non HMO)

## 2019-08-07 ENCOUNTER — Encounter: Payer: Self-pay | Admitting: Family Medicine

## 2019-08-07 ENCOUNTER — Other Ambulatory Visit: Payer: Self-pay

## 2019-08-07 VITALS — BP 106/74 | HR 74 | Temp 97.0°F | Resp 18 | Ht 65.5 in | Wt 202.8 lb

## 2019-08-07 DIAGNOSIS — Z01818 Encounter for other preprocedural examination: Secondary | ICD-10-CM

## 2019-08-07 NOTE — Progress Notes (Signed)
Subjective:    Patient ID: Olivia Wall, female    DOB: 1976/04/17, 43 y.o.   MRN: [id number]  HPI   Patient presents to clinic for medical clearance for tummy tuck surgery.  Surgery originally was was to be November 9, but patient is going to have to push the surgery back due to her mother needing a surgery, and she will have to care for her.  Patient decided to keep the appointment to please get EKG and chest x-ray component of preop testing completed.  Most recent blood work from her physical in September reviewed and is all very good.  She is a former smoker, currently non-smoker.  Diabetes is diet controlled  Lab Results  Component Value Date   HGBA1C 6.2 (H) 06/30/2019    Patient Active Problem List   Diagnosis Date Noted  . Diabetes mellitus without complication ([place]) 123456  . Vitamin D deficiency 07/22/2018   Social History   Tobacco Use  . Smoking status: Former Smoker    Packs/day: 0.50    Years: 15.00    Pack years: 7.50    Types: Cigarettes    Quit date: 03/05/2012    Years since quitting: 7.4  . Smokeless tobacco: Never Used  Substance Use Topics  . Alcohol use: No    Review of Systems  Constitutional: Negative for chills, fatigue and fever.  HENT: Negative for congestion, ear pain, sinus pain and sore throat.   Eyes: Negative.   Respiratory: Negative for cough, shortness of breath and wheezing.   Cardiovascular: Negative for chest pain, palpitations and leg swelling.  Gastrointestinal: Negative for abdominal pain, diarrhea, nausea and vomiting.  Genitourinary: Negative for dysuria, frequency and urgency.  Musculoskeletal: Negative for arthralgias and myalgias.  Skin: Negative for color change, pallor and rash.  Neurological: Negative for syncope, light-headedness and headaches.  Psychiatric/Behavioral: The patient is not nervous/anxious.       Objective:   Physical Exam Vitals signs and nursing note reviewed.  Constitutional:    General: She is not in acute distress.    Appearance: She is not ill-appearing, toxic-appearing or diaphoretic.  HENT:     Head: Normocephalic and atraumatic.     Right Ear: Tympanic membrane, ear canal and external ear normal.     Left Ear: Tympanic membrane, ear canal and external ear normal.  Eyes:     General: No scleral icterus.    Extraocular Movements: Extraocular movements intact.     Conjunctiva/sclera: Conjunctivae normal.     Pupils: Pupils are equal, round, and reactive to light.  Cardiovascular:     Rate and Rhythm: Normal rate and regular rhythm.     Heart sounds: Normal heart sounds.  Pulmonary:     Effort: Pulmonary effort is normal. No respiratory distress.     Breath sounds: Normal breath sounds.  Abdominal:     General: Bowel sounds are normal. There is no distension.     Palpations: Abdomen is soft.     Tenderness: There is no abdominal tenderness. There is no guarding.  Skin:    General: Skin is warm and dry.     Coloration: Skin is not jaundiced or pale.  Neurological:     General: No focal deficit present.     Mental Status: She is alert and oriented to person, place, and time.     Gait: Gait normal.  Psychiatric:        Mood and Affect: Mood normal.        Behavior:  Behavior normal.    Today's Vitals   08/07/19 1134  BP: 106/74  Pulse: 74  Resp: 18  Temp: (!) 97 F (36.1 C)  TempSrc: Temporal  SpO2: 98%  Weight: 202 lb 12.8 oz (92 kg)  Height: 5' 5.5" (1.664 m)   Body mass index is 33.23 kg/m.     Assessment & Plan:    Preop examination - EKG done in clinic & reviewed by me.  Rhythm is normal sinus and no acute abnormalities noted on EKG.  Chest x-ray performed in clinic, chest x-ray shows clear lungs and no active cardiopulmonary disease.  Patient made aware that she may need to return to clinic again for a clearance that is within 30 days of her surgery, but the EKG and x-ray testing should be admissible to her surgeon as part of her  clearance.  Patient will keep regular follow-up with me as planned.  She will return to clinic sooner if any issues arise.

## 2019-08-08 ENCOUNTER — Ambulatory Visit: Payer: Managed Care, Other (non HMO) | Admitting: Family Medicine

## 2019-08-17 ENCOUNTER — Ambulatory Visit
Admission: RE | Admit: 2019-08-17 | Discharge: 2019-08-17 | Disposition: A | Discharge status: Home / Self Care | Payer: Managed Care, Other (non HMO) | Source: Ambulatory Visit | Attending: Obstetrics & Gynecology | Admitting: Obstetrics & Gynecology

## 2019-08-17 DIAGNOSIS — Z1231 Encounter for screening mammogram for malignant neoplasm of breast: Secondary | ICD-10-CM | POA: Insufficient documentation

## 2019-10-10 ENCOUNTER — Telehealth: Payer: Self-pay | Admitting: Family

## 2019-10-10 NOTE — Telephone Encounter (Signed)
Called patient to set up virtual. She needs surgery clearence and is requesting labs, EKG, and x-ray. If patient passes screening will Arnett see her in office?

## 2019-10-11 NOTE — Telephone Encounter (Signed)
That's fine to come in.  Now I would like to do most of visit over doxy, which she can be in her car , then she may come in for EKG, and I can do my exam. I would like to reduce in person contact with her to keep her safe so we will do most of the 'talking' over doxy.  Please ensure she passes all questions and inform her that if any abnormalities on EKG, she would need to have cardiac consult. When and what type of surgery is she planning?

## 2019-10-16 ENCOUNTER — Ambulatory Visit (INDEPENDENT_AMBULATORY_CARE_PROVIDER_SITE_OTHER): Payer: Managed Care, Other (non HMO) | Admitting: Family

## 2019-10-16 ENCOUNTER — Other Ambulatory Visit: Payer: Self-pay

## 2019-10-16 ENCOUNTER — Encounter: Payer: Self-pay | Admitting: Family

## 2019-10-16 VITALS — Ht 65.5 in | Wt 204.0 lb

## 2019-10-16 DIAGNOSIS — Z01818 Encounter for other preprocedural examination: Secondary | ICD-10-CM | POA: Diagnosis not present

## 2019-10-16 DIAGNOSIS — E119 Type 2 diabetes mellitus without complications: Secondary | ICD-10-CM

## 2019-10-16 NOTE — Progress Notes (Signed)
Virtual Visit via Video Note  I connected with@  on 10/16/19 at 10:30 AM EST by a video enabled telemedicine application and verified that I am speaking with the correct person using two identifiers.  Location patient: home Location provider:home office Persons participating in the virtual visit: patient, provider  I discussed the limitations of evaluation and management by telemedicine and the availability of in person appointments. The patient expressed understanding and agreed to proceed.   HPI: Chief complaint needs preoperative clearance.  'tummy tuck' in [facility] with Dr Andria Rhein .  She has been planning the surgery for over 2 years now  scheduled 11/14/19.  Needs an EKG and labs done prior too.  Flies down couple days in advance, and stays one week for recovery.   Walks the neighborhood, 1.5 mile most days. No CP, sob, left arm pain.   Never seen cardiology. No lung disease history. No known bleeding disorder.  No complications of anesthesia in the past  DM- was prescribed metformin , however decided not to start since pre Dm. Eating less sugar, drinking less soda  Weight, height, procedure ( abdominoplasty) Needs letter head that she is cleared for procedure.  ROS: See pertinent positives and negatives per HPI.  Past Medical History:  Diagnosis Date  . Abnormal Pap smear of cervix   . Cancer ([place])   . Chicken pox   . Diabetes mellitus without complication ([place])   . Ovarian cyst     Past Surgical History:  Procedure Laterality Date  . ABDOMINAL HYSTERECTOMY    . CESAREAN SECTION  1994  . LAPAROSCOPIC BILATERAL SALPINGECTOMY Bilateral 05/07/2016   Procedure: LAPAROSCOPIC BILATERAL SALPINGECTOMY;  Surgeon: Honor Loh Ward, MD;  Location: [facility];  Service: Gynecology;  Laterality: Bilateral;  . LAPAROSCOPIC HYSTERECTOMY N/A 05/07/2016   Procedure: HYSTERECTOMY TOTAL LAPAROSCOPIC;  Surgeon: Honor Loh Ward, MD;  Location: [facility];  Service: Gynecology;  Laterality:  N/A;  . LEEP N/A 03/13/2016   Procedure: LOOP ELECTROSURGICAL EXCISION PROCEDURE (LEEP);  Surgeon: Honor Loh Ward, MD;  Location: [facility] OR[facility];  Service: Gynecology;  Laterality: N/A;  . SHOULDER SURGERY Right 2014    Family History  Problem Relation Age of Onset  . Multiple sclerosis Mother   . Diabetes Maternal Grandmother   . Cancer Paternal Grandmother   . COPD Paternal Grandfather   . Breast cancer Paternal Aunt        <50  . Breast cancer Cousin        first cousin <50    SOCIAL HX: former smoker  No current outpatient medications on file.  EXAM:  VITALS per patient if applicable: There were no vitals filed for this visit. Wt Readings from Last 3 Encounters:  10/16/19 204 lb (92.5 kg)  08/07/19 202 lb 12.8 oz (92 kg)  06/30/19 204 lb 9.6 oz (92.8 kg)     GENERAL: alert, oriented, appears well and in no acute distress  HEENT: atraumatic, conjunttiva clear, no obvious abnormalities on inspection of external nose and ears  NECK: normal movements of the head and neck  LUNGS: on inspection no signs of respiratory distress, breathing rate appears normal, no obvious gross SOB, gasping or wheezing  CV: no obvious cyanosis  MS: moves all visible extremities without noticeable abnormality  PSYCH/NEURO: pleasant and cooperative, no obvious depression or anxiety, speech and thought processing grossly intact  ASSESSMENT AND PLAN:  Discussed the following assessment and plan:  Pre-op evaluation - Plan: Hemoglobin A1c, Comprehensive metabolic panel-FUTURE, CBC with Differential/Platelet, EKG 12-Lead,  Protime-INR, APTT, HIV antibody, DG Chest 2 View, hCG, serum, qualitative  Diabetes mellitus without complication (HCC)  Pre-operative clearance Problem List Items Addressed This Visit      Endocrine   Diabetes mellitus without complication ([place])    Diet managed, pending A1c        Other   Pre-operative clearance    Asymptomatic at this time.  No overt cardiac symptoms  however discussed the limitations of a virtual visit . patient scheduled for abdominoplasty 11/14/19 in [place].  Discussed EKG, chest x-ray, and labs.  Unfortunately our radiology is down at this time; discussed with patient that we will coordinate with patient how to have this done at [facility]  Will provide letter after results.        Other Visit Diagnoses    Pre-op evaluation    -  Primary   Relevant Orders   Hemoglobin A1c   Comprehensive metabolic panel-FUTURE   CBC with Differential/Platelet   EKG 12-Lead   Protime-INR   APTT   HIV antibody   DG Chest 2 View   hCG, serum, qualitative      -we discussed possible serious and likely etiologies, options for evaluation and workup, limitations of telemedicine visit vs in person visit, treatment, treatment risks and precautions. Pt prefers to treat via telemedicine empirically rather then risking or undertaking an in person visit at this moment. Patient agrees to seek prompt in person care if worsening, new symptoms arise, or if is not improving with treatment.   I discussed the assessment and treatment plan with the patient. The patient was provided an opportunity to ask questions and all were answered. The patient agreed with the plan and demonstrated an understanding of the instructions.   The patient was advised to call back or seek an in-person evaluation if the symptoms worsen or if the condition fails to improve as anticipated.   Mable Paris, FNP

## 2019-10-16 NOTE — Assessment & Plan Note (Signed)
Asymptomatic at this time.  No overt cardiac symptoms however discussed the limitations of a virtual visit . patient scheduled for abdominoplasty 11/14/19 in [place].  Discussed EKG, chest x-ray, and labs.  Unfortunately our radiology is down at this time; discussed with patient that we will coordinate with patient how to have this done at [facility].  Will provide letter after results.

## 2019-10-16 NOTE — Telephone Encounter (Signed)
I called & [facility] lab as well as radiology can see your orders. I was also given the number to centralized scheduling so that patient could scheduled EKG. I have called patient as well to let her know this & gave her number to call to schedule EKG.

## 2019-10-16 NOTE — Telephone Encounter (Signed)
noted 

## 2019-10-16 NOTE — Telephone Encounter (Signed)
Would you call [facility] labs [phone])  and ensure they can see A1c, HIV, PT, INR, CBC, CMP.  Essentially all the labs ordered today. Be sure they can see them before patient goes there.   Please also asked to speak with radiology [phone])  to ensure they can see the chest x-ray.  Please also ask about EKG aware that can be done as preop as part of the hospital ( I know they have preop there.. where can we do EKG and they can send to me?)

## 2019-10-16 NOTE — Assessment & Plan Note (Signed)
Diet managed, pending A1c

## 2019-10-18 ENCOUNTER — Ambulatory Visit
Admission: RE | Admit: 2019-10-18 | Discharge: 2019-10-18 | Disposition: A | Discharge status: Home / Self Care | Payer: Managed Care, Other (non HMO) | Source: Ambulatory Visit | Attending: Family | Admitting: Family

## 2019-10-18 ENCOUNTER — Telehealth: Payer: Self-pay | Admitting: Family

## 2019-10-18 ENCOUNTER — Other Ambulatory Visit: Payer: Self-pay

## 2019-10-18 ENCOUNTER — Encounter: Payer: Self-pay | Admitting: Family

## 2019-10-18 DIAGNOSIS — Z01818 Encounter for other preprocedural examination: Secondary | ICD-10-CM

## 2019-10-18 DIAGNOSIS — Z01810 Encounter for preprocedural cardiovascular examination: Secondary | ICD-10-CM

## 2019-10-18 NOTE — Telephone Encounter (Signed)
Pt needs labs sent to [facility] not to [facility]

## 2019-10-19 ENCOUNTER — Other Ambulatory Visit: Payer: Self-pay

## 2019-10-19 DIAGNOSIS — Z01818 Encounter for other preprocedural examination: Secondary | ICD-10-CM

## 2019-10-19 NOTE — Telephone Encounter (Signed)
I called and spoke with patent & let her know that I had reordered labs for [facility].

## 2019-10-23 ENCOUNTER — Other Ambulatory Visit: Payer: Self-pay | Admitting: Family

## 2019-10-23 ENCOUNTER — Other Ambulatory Visit: Payer: Self-pay

## 2019-10-23 DIAGNOSIS — Z01818 Encounter for other preprocedural examination: Secondary | ICD-10-CM

## 2019-10-24 LAB — CBC WITH DIFFERENTIAL/PLATELET
Basophils Absolute: 0 10*3/uL (ref 0.0–0.2)
Basos: 0 %
EOS (ABSOLUTE): 0.1 10*3/uL (ref 0.0–0.4)
Eos: 1 %
Hematocrit: 39.9 % (ref 34.0–46.6)
Hemoglobin: 13.9 g/dL (ref 11.1–15.9)
Immature Grans (Abs): 0 10*3/uL (ref 0.0–0.1)
Immature Granulocytes: 0 %
Lymphocytes Absolute: 2.8 10*3/uL (ref 0.7–3.1)
Lymphs: 29 %
MCH: 33.7 pg — ABNORMAL HIGH (ref 26.6–33.0)
MCHC: 34.8 g/dL (ref 31.5–35.7)
MCV: 97 fL (ref 79–97)
Monocytes Absolute: 0.7 10*3/uL (ref 0.1–0.9)
Monocytes: 7 %
Neutrophils Absolute: 6 10*3/uL (ref 1.4–7.0)
Neutrophils: 63 %
Platelets: 374 10*3/uL (ref 150–450)
RBC: 4.12 x10E6/uL (ref 3.77–5.28)
RDW: 11.2 % — ABNORMAL LOW (ref 11.7–15.4)
WBC: 9.6 10*3/uL (ref 3.4–10.8)

## 2019-10-24 LAB — COMPREHENSIVE METABOLIC PANEL
ALT: 14 IU/L (ref 0–32)
AST: 15 IU/L (ref 0–40)
Albumin/Globulin Ratio: 1.3 (ref 1.2–2.2)
Albumin: 4.6 g/dL (ref 3.8–4.8)
Alkaline Phosphatase: 91 IU/L (ref 39–117)
BUN/Creatinine Ratio: 11 (ref 9–23)
BUN: 9 mg/dL (ref 6–24)
Bilirubin Total: 0.5 mg/dL (ref 0.0–1.2)
CO2: 20 mmol/L (ref 20–29)
Calcium: 9.8 mg/dL (ref 8.7–10.2)
Chloride: 99 mmol/L (ref 96–106)
Creatinine, Ser: 0.8 mg/dL (ref 0.57–1.00)
GFR calc Af Amer: 106 mL/min/{1.73_m2} (ref >59)
GFR calc non Af Amer: 92 mL/min/{1.73_m2} (ref >59)
Globulin, Total: 3.5 g/dL (ref 1.5–4.5)
Glucose: 136 mg/dL — ABNORMAL HIGH (ref 65–99)
Potassium: 4.2 mmol/L (ref 3.5–5.2)
Sodium: 135 mmol/L (ref 134–144)
Total Protein: 8.1 g/dL (ref 6.0–8.5)

## 2019-10-24 LAB — PROTIME-INR
INR: 1 (ref 0.9–1.2)
Prothrombin Time: 10.5 s (ref 9.1–12.0)

## 2019-10-24 LAB — HCG, SERUM, QUALITATIVE: hCG,Beta Subunit,Qual,Serum: NEGATIVE m[IU]/mL (ref <6)

## 2019-10-24 LAB — HEMOGLOBIN A1C
Est. average glucose Bld gHb Est-mCnc: 134 mg/dL
Hgb A1c MFr Bld: 6.3 % — ABNORMAL HIGH (ref 4.8–5.6)

## 2019-10-24 LAB — HIV ANTIBODY (ROUTINE TESTING W REFLEX): HIV Screen 4th Generation wRfx: NONREACTIVE

## 2019-10-24 LAB — APTT: aPTT: 27 s (ref 24–33)

## 2019-10-25 ENCOUNTER — Telehealth: Payer: Self-pay | Admitting: Family

## 2019-10-25 NOTE — Telephone Encounter (Signed)
I called and let patient know EKG was viewed normal by Dr. Rockey Situ. Will you let me know when letter for surgical clearance in done?

## 2019-10-25 NOTE — Telephone Encounter (Signed)
Dr Rockey Situ , cardiologist, reviewed her EKG and felt it normal.   As long as patient has not experienced any cardiac symptoms (she reported them as negative on our virtual), I will proceed with letter for her surgery.   -- Message ----- From: Minna Merritts, MD Sent: 10/24/2019   6:08 PM EST To: Burnard Hawthorne, FNP Subject: RE: NON urgent EKG pre op question             EKG Looks normal to me If T wave inversion in AVF , then ABN Thx TG

## 2019-10-25 NOTE — Telephone Encounter (Signed)
Letter released to mychart.

## 2019-10-25 NOTE — Telephone Encounter (Signed)
Patient called and notified that letter was released. She asked if she ended up needing anything from cardiology stating that Dr. Rockey Situ felt that EKG was normal could she get that? I told her to let us know if that ended up being the case.

## 2019-11-01 ENCOUNTER — Encounter: Payer: Self-pay | Admitting: Family

## 2019-11-03 ENCOUNTER — Other Ambulatory Visit: Payer: Self-pay | Admitting: Family

## 2019-11-03 NOTE — Telephone Encounter (Signed)
Spoke with patient She states she no longer needs a note from cardiology.  She politely declined a referral to [facility].  Advised her of her personal risk for elective surgery  including prediabetes and obesity; she is aware and desires to proceed with surgery

## 2019-11-03 NOTE — Telephone Encounter (Signed)
Called pt If she calls back, come get me I want to discuss her mychart message re: cardiology

## 2019-11-16 ENCOUNTER — Encounter: Payer: Self-pay | Admitting: Family

## 2019-11-17 NOTE — Telephone Encounter (Signed)
Forward to dr byrnett to see if he would be comfortable removing the drain and sutures. If not,  Plastics.

## 2019-11-20 ENCOUNTER — Telehealth: Payer: Self-pay | Admitting: Lab

## 2019-11-20 ENCOUNTER — Other Ambulatory Visit: Payer: Self-pay | Admitting: Family

## 2019-11-20 DIAGNOSIS — Z01818 Encounter for other preprocedural examination: Secondary | ICD-10-CM

## 2019-11-20 NOTE — Telephone Encounter (Signed)
Pt called office back. I spoke to her and told her that a referral was put in for her to see a surgeon and someone will be calling her. Pt stated she understood with No questions.

## 2019-11-20 NOTE — Telephone Encounter (Signed)
Called Pt No answer left VM to call office back. 

## 2019-11-20 NOTE — Telephone Encounter (Signed)
Would you call Dr Merry Proud Byrnett's office and see if he is able to see staff message on this patient?  [phone]  You can speak to his staff.  She had a tummy tuck in [place] [id number] and needs complicated sutures and drain removed around 1/27-1/29 so we are in a bit of a crunch.   I am not sure why she didn't stay in [place] for this with her surgeons.   If Dr Bary Castilla cant or is unavailable in short span to remove, let me know and I can refer her to plastics.   [place]  Lastly, update patient that we are trying to quickly find surgeon or plastic surgeon to remove these.  I spoke with colleague in our office, and unfortunately this more complicated than simple suture removal.

## 2019-11-20 NOTE — Telephone Encounter (Signed)
Spoke to Pt and told her a referral was put in to see a Psychologist, sport and exercise. Pt stated she understood with No questions.

## 2019-11-20 NOTE — Telephone Encounter (Signed)
I called Dr. Jeronimo Greaves office, they said that he was in surgery all day and that he does not do plastic surgery only General surgery, they gave me Dr. Audelia Hives and shes in [facility] network and she does plastic surgery.

## 2019-11-20 NOTE — Telephone Encounter (Signed)
Called Pt No answer left a VM to call office

## 2019-11-23 ENCOUNTER — Encounter: Payer: Self-pay | Admitting: Plastic Surgery

## 2019-11-23 ENCOUNTER — Ambulatory Visit: Payer: Managed Care, Other (non HMO) | Admitting: Plastic Surgery

## 2019-11-23 ENCOUNTER — Other Ambulatory Visit: Payer: Self-pay

## 2019-11-23 VITALS — BP 113/81 | HR 79 | Temp 97.3°F | Ht 65.0 in | Wt 194.6 lb

## 2019-11-23 DIAGNOSIS — T148XXA Other injury of unspecified body region, initial encounter: Secondary | ICD-10-CM

## 2019-11-23 NOTE — Progress Notes (Signed)
   Referring Provider Burnard Hawthorne, FNP [address] [place],  [place] [address]   CC:  Chief Complaint  Patient presents with  . Advice Only    Patient here for suture and drain removal fro tummy tuck that was done in [place] is an 44 y.o. female.   HPI: Patient presents postoperatively from an abdominoplasty done in [place] about 2 weeks ago.  She says her plan for follow-up was to get her drains and sutures taken out somewhere in [place].  She lives in this area.  She not had any problems but needs the sutures taken out from her umbilicus and her drains removed which had been putting out less than 30 cc/day for several days.  She does not have any other issues postoperatively.  No Known Allergies  No outpatient encounter medications on file as of 11/23/2019.   No facility-administered encounter medications on file as of 11/23/2019.     Past Medical History:  Diagnosis Date  . Abnormal Pap smear of cervix   . Cancer ([place])   . Chicken pox   . Diabetes mellitus without complication ([place])   . Ovarian cyst     Past Surgical History:  Procedure Laterality Date  . ABDOMINAL HYSTERECTOMY    . CESAREAN SECTION  1994  . LAPAROSCOPIC BILATERAL SALPINGECTOMY Bilateral 05/07/2016   Procedure: LAPAROSCOPIC BILATERAL SALPINGECTOMY;  Surgeon: Honor Loh Ward, MD;  Location: [facility] ORS;  Service: Gynecology;  Laterality: Bilateral;  . LAPAROSCOPIC HYSTERECTOMY N/A 05/07/2016   Procedure: HYSTERECTOMY TOTAL LAPAROSCOPIC;  Surgeon: Honor Loh Ward, MD;  Location: [facility];  Service: Gynecology;  Laterality: N/A;  . LEEP N/A 03/13/2016   Procedure: LOOP ELECTROSURGICAL EXCISION PROCEDURE (LEEP);  Surgeon: Honor Loh Ward, MD;  Location: [facility];  Service: Gynecology;  Laterality: N/A;  . SHOULDER SURGERY Right 2014    Family History  Problem Relation Age of Onset  . Multiple sclerosis Mother   . Diabetes Maternal Grandmother   . Cancer Paternal  Grandmother   . COPD Paternal Grandfather   . Breast cancer Paternal Aunt        <50  . Breast cancer Cousin        first cousin <50    Social History   Social History Narrative  . Not on file     Review of Systems General: Denies fevers, chills, weight loss CV: Denies chest pain, shortness of breath, palpitations  Physical Exam Vitals with BMI 11/23/2019 10/16/2019 08/07/2019  Height 5\' 5"  5' 5.5" 5' 5.5"  Weight 194 lbs 10 oz 204 lbs 202 lbs 13 oz  BMI 32.38 [id number]  Systolic 123456 - [id number]  Diastolic 81 - 74  Pulse 79 - 74    General:  No acute distress,  Alert and oriented, Non-Toxic, Normal speech and affect Abdomen: Abdomen is soft nontender.  Her scar is healing fine.  Prolene sutures from the umbilicus were all removed without issue.  Her drains were also removed without issue.  I do not see any obvious evidence of fluid collections or wound breakdown.  Assessment/Plan Patient is postop from abdominoplasty and looks to be healing fine.  I explained that having no follow-up care after engaging in operation is not the best plan.  However I do think that she is healing fine and will see her again as needed.  Cindra Presume 11/23/2019, 1:30 PM

## 2020-07-02 ENCOUNTER — Encounter: Payer: Managed Care, Other (non HMO) | Admitting: Family Medicine

## 2020-07-02 ENCOUNTER — Encounter: Payer: Self-pay | Admitting: Family

## 2020-07-02 ENCOUNTER — Ambulatory Visit (INDEPENDENT_AMBULATORY_CARE_PROVIDER_SITE_OTHER): Payer: Managed Care, Other (non HMO) | Admitting: Family

## 2020-07-02 ENCOUNTER — Other Ambulatory Visit: Payer: Self-pay

## 2020-07-02 VITALS — BP 120/82 | HR 73 | Temp 98.1°F | Ht 66.0 in | Wt 200.2 lb

## 2020-07-02 DIAGNOSIS — E119 Type 2 diabetes mellitus without complications: Secondary | ICD-10-CM

## 2020-07-02 DIAGNOSIS — Z0001 Encounter for general adult medical examination with abnormal findings: Secondary | ICD-10-CM | POA: Diagnosis not present

## 2020-07-02 DIAGNOSIS — R232 Flushing: Secondary | ICD-10-CM | POA: Insufficient documentation

## 2020-07-02 DIAGNOSIS — M25551 Pain in right hip: Secondary | ICD-10-CM | POA: Diagnosis not present

## 2020-07-02 DIAGNOSIS — Z0000 Encounter for general adult medical examination without abnormal findings: Secondary | ICD-10-CM | POA: Insufficient documentation

## 2020-07-02 NOTE — Assessment & Plan Note (Signed)
Prediabetic

## 2020-07-02 NOTE — Assessment & Plan Note (Signed)
CBE performed. Deferred pelvic exam in the absence of complaints and she follows with gyn. Declines genetic consult.

## 2020-07-02 NOTE — Assessment & Plan Note (Signed)
Etiology nonspecific. She is not experiencing weight loss or drenching sheets in sweat. Pending thorough lab evaluation and 2 month follow up. Advised her as well to discuss with Dr Leonides Schanz in 2 weeks at f/u.

## 2020-07-02 NOTE — Assessment & Plan Note (Signed)
Worsening. Symptoms c/w trochanteric bursitis. She has seen orthopedics and had cortisone injection. Unable to see records. She reports xray done. She declines imaging today in our office. She prefers to start home exercises versus start PT at this time. Close follow up.

## 2020-07-02 NOTE — Patient Instructions (Signed)
Please discuss hot flashes as well with Dr ward in 2 weeks when you see her.   We can try home excercises for suspected right hip trochanteric bursitis. If no resolve, please let me know   Hip Bursitis Rehab Ask your health care provider which exercises are safe for you. Do exercises exactly as told by your health care provider and adjust them as directed. It is normal to feel mild stretching, pulling, tightness, or discomfort as you do these exercises. Stop right away if you feel sudden pain or your pain gets worse. Do not begin these exercises until told by your health care provider. Stretching exercise This exercise warms up your muscles and joints and improves the movement and flexibility of your hip. This exercise also helps to relieve pain and stiffness. Iliotibial band stretch An iliotibial band is a strong band of muscle tissue that runs from the outer side of your hip to the outer side of your thigh and knee. 1. Lie on your side with your left / right leg in the top position. 2. Bend your left / right knee and grab your ankle. Stretch out your bottom arm to help you balance. 3. Slowly bring your knee back so your thigh is behind your body. 4. Slowly lower your knee toward the floor until you feel a gentle stretch on the outside of your left / right thigh. If you do not feel a stretch and your knee will not fall farther, place the heel of your other foot on top of your knee and pull your knee down toward the floor with your foot. 5. Hold this position for __________ seconds. 6. Slowly return to the starting position. Repeat __________ times. Complete this exercise __________ times a day. Strengthening exercises These exercises build strength and endurance in your hip and pelvis. Endurance is the ability to use your muscles for a long time, even after they get tired. Bridge This exercise strengthens the muscles that move your thigh backward (hip extensors). 1. Lie on your back on a firm  surface with your knees bent and your feet flat on the floor. 2. Tighten your buttocks muscles and lift your buttocks off the floor until your trunk is level with your thighs. ? Do not arch your back. ? You should feel the muscles working in your buttocks and the back of your thighs. If you do not feel these muscles, slide your feet 1-2 inches (2.5-5 cm) farther away from your buttocks. ? If this exercise is too easy, try doing it with your arms crossed over your chest. 3. Hold this position for __________ seconds. 4. Slowly lower your hips to the starting position. 5. Let your muscles relax completely after each repetition. Repeat __________ times. Complete this exercise __________ times a day. Squats This exercise strengthens the muscles in front of your thigh and knee (quadriceps). 1. Stand in front of a table, with your feet and knees pointing straight ahead. You may rest your hands on the table for balance but not for support. 2. Slowly bend your knees and lower your hips like you are going to sit in a chair. ? Keep your weight over your heels, not over your toes. ? Keep your lower legs upright so they are parallel with the table legs. ? Do not let your hips go lower than your knees. ? Do not bend lower than told by your health care provider. ? If your hip pain increases, do not bend as low. 3. Hold the squat  position for __________ seconds. 4. Slowly push with your legs to return to standing. Do not use your hands to pull yourself to standing. Repeat __________ times. Complete this exercise __________ times a day. Hip hike 1. Stand sideways on a bottom step. Stand on your left / right leg with your other foot unsupported next to the step. You can hold on to the railing or wall for balance if needed. 2. Keep your knees straight and your torso square. Then lift your left / right hip up toward the ceiling. 3. Hold this position for __________ seconds. 4. Slowly let your left / right hip  lower toward the floor, past the starting position. Your foot should get closer to the floor. Do not lean or bend your knees. Repeat __________ times. Complete this exercise __________ times a day. Single leg stand 1. Without shoes, stand near a railing or in a doorway. You may hold on to the railing or door frame as needed for balance. 2. Squeeze your left / right buttock muscles, then lift up your other foot. ? Do not let your left / right hip push out to the side. ? It is helpful to stand in front of a mirror for this exercise so you can watch your hip. 3. Hold this position for __________ seconds. Repeat __________ times. Complete this exercise __________ times a day. This information is not intended to replace advice given to you by your health care provider. Make sure you discuss any questions you have with your health care provider. Document Revised: 02/06/2019 Document Reviewed: 02/06/2019 Elsevier Patient Education  [place] Maintenance, Female Adopting a healthy lifestyle and getting preventive care are important in promoting health and wellness. Ask your health care provider about:  The right schedule for you to have regular tests and exams.  Things you can do on your own to prevent diseases and keep yourself healthy. What should I know about diet, weight, and exercise? Eat a healthy diet   Eat a diet that includes plenty of vegetables, fruits, low-fat dairy products, and lean protein.  Do not eat a lot of foods that are high in solid fats, added sugars, or sodium. Maintain a healthy weight Body mass index (BMI) is used to identify weight problems. It estimates body fat based on height and weight. Your health care provider can help determine your BMI and help you achieve or maintain a healthy weight. Get regular exercise Get regular exercise. This is one of the most important things you can do for your health. Most adults should:  Exercise for at least  150 minutes each week. The exercise should increase your heart rate and make you sweat (moderate-intensity exercise).  Do strengthening exercises at least twice a week. This is in addition to the moderate-intensity exercise.  Spend less time sitting. Even light physical activity can be beneficial. Watch cholesterol and blood lipids Have your blood tested for lipids and cholesterol at 42 years of age, then have this test every 5 years. Have your cholesterol levels checked more often if:  Your lipid or cholesterol levels are high.  You are older than 42 years of age.  You are at high risk for heart disease. What should I know about cancer screening? Depending on your health history and family history, you may need to have cancer screening at various ages. This may include screening for:  Breast cancer.  Cervical cancer.  Colorectal cancer.  Skin cancer.  Lung cancer. What should I  know about heart disease, diabetes, and high blood pressure? Blood pressure and heart disease  High blood pressure causes heart disease and increases the risk of stroke. This is more likely to develop in people who have high blood pressure readings, are of African descent, or are overweight.  Have your blood pressure checked: ? Every 3-5 years if you are 7-22 years of age. ? Every year if you are 28 years old or older. Diabetes Have regular diabetes screenings. This checks your fasting blood sugar level. Have the screening done:  Once every three years after age 68 if you are at a normal weight and have a low risk for diabetes.  More often and at a younger age if you are overweight or have a high risk for diabetes. What should I know about preventing infection? Hepatitis B If you have a higher risk for hepatitis B, you should be screened for this virus. Talk with your health care provider to find out if you are at risk for hepatitis B infection. Hepatitis C Testing is recommended for:  Everyone  born from [birth year] through 1963.  Anyone with known risk factors for hepatitis C. Sexually transmitted infections (STIs)  Get screened for STIs, including gonorrhea and chlamydia, if: ? You are sexually active and are younger than 42 years of age. ? You are older than 42 years of age and your health care provider tells you that you are at risk for this type of infection. ? Your sexual activity has changed since you were last screened, and you are at increased risk for chlamydia or gonorrhea. Ask your health care provider if you are at risk.  Ask your health care provider about whether you are at high risk for HIV. Your health care provider may recommend a prescription medicine to help prevent HIV infection. If you choose to take medicine to prevent HIV, you should first get tested for HIV. You should then be tested every 3 months for as long as you are taking the medicine. Pregnancy  If you are about to stop having your period (premenopausal) and you may become pregnant, seek counseling before you get pregnant.  Take 400 to 800 micrograms (mcg) of folic acid every day if you become pregnant.  Ask for birth control (contraception) if you want to prevent pregnancy. Osteoporosis and menopause Osteoporosis is a disease in which the bones lose minerals and strength with aging. This can result in bone fractures. If you are 23 years old or older, or if you are at risk for osteoporosis and fractures, ask your health care provider if you should:  Be screened for bone loss.  Take a calcium or vitamin D supplement to lower your risk of fractures.  Be given hormone replacement therapy (HRT) to treat symptoms of menopause. Follow these instructions at home: Lifestyle  Do not use any products that contain nicotine or tobacco, such as cigarettes, e-cigarettes, and chewing tobacco. If you need help quitting, ask your health care provider.  Do not use street drugs.  Do not share needles.  Ask your  health care provider for help if you need support or information about quitting drugs. Alcohol use  Do not drink alcohol if: ? Your health care provider tells you not to drink. ? You are pregnant, may be pregnant, or are planning to become pregnant.  If you drink alcohol: ? Limit how much you use to 0-1 drink a day. ? Limit intake if you are breastfeeding.  Be aware of how much  alcohol is in your drink. In the [place] one drink equals one 12 oz bottle of beer (355 mL), one 5 oz glass of wine (148 mL), or one 1 oz glass of hard liquor (44 mL). General instructions  Schedule regular health, dental, and eye exams.  Stay current with your vaccines.  Tell your health care provider if: ? You often feel depressed. ? You have ever been abused or do not feel safe at home. Summary  Adopting a healthy lifestyle and getting preventive care are important in promoting health and wellness.  Follow your health care provider's instructions about healthy diet, exercising, and getting tested or screened for diseases.  Follow your health care provider's instructions on monitoring your cholesterol and blood pressure. This information is not intended to replace advice given to you by your health care provider. Make sure you discuss any questions you have with your health care provider. Document Revised: 10/05/2018 Document Reviewed: 10/05/2018 Elsevier Patient Education  2020 [employer].

## 2020-07-02 NOTE — Progress Notes (Signed)
Subjective:    Patient ID: Olivia Wall, female    DOB: 1976/10/05, 44 y.o.   MRN: [id number]  CC: Olivia Wall is a 44 y.o. female who presents today for physical exam.    HPI: Complains 'hot flashes' every night at 10pm feet 'turn hot' for 2 years and becoming more frequent. She doesn't drench the sheet.Will occur in winter and summer. Dresses in light clothes and has ceiling fan in room.  Feet are diaphoretic and red in color and then feeling goes through entire body. No pain or cramping in legs when walking.   No unusual bone pain, weight loss, fever  Complains right hip pain x 3 months, worsening. No groin pain or numbness. No leg numbness or tingling. Painful to sleep on it. No injury. No low back pain.   Has seen orthopedic with Dr Percell Miller and had xray done  Couple of months ago. Has had steroid injection with relief however pain is returning.     Colorectal Cancer Screening: no early family history Breast Cancer Screening: Mammogram UTD; strong family history. Declines genetic consult.  Follows with Dr Leonides Schanz Cervical Cancer Screening: h/o hysterectomy, follows with GYN and sees Dr Leonides Schanz in 2 weeks.         Tetanus - utd       Hepatitis C screening - Candidate for, consents  Labs: Screening labs today. Exercise: Gets regular exercise walking 1.5 mile every day.  Alcohol use: none Smoking/tobacco use: smoker.    HISTORY:  Past Medical History:  Diagnosis Date  . Abnormal Pap smear of cervix   . Cancer ([place])   . Chicken pox   . Diabetes mellitus without complication ([place])   . Ovarian cyst     Past Surgical History:  Procedure Laterality Date  . ABDOMINAL HYSTERECTOMY    . CESAREAN SECTION  1994  . LAPAROSCOPIC BILATERAL SALPINGECTOMY Bilateral 05/07/2016   Procedure: LAPAROSCOPIC BILATERAL SALPINGECTOMY;  Surgeon: Honor Loh Ward, MD;  Location: [facility];  Service: Gynecology;  Laterality: Bilateral;  . LAPAROSCOPIC HYSTERECTOMY N/A 05/07/2016   Procedure:  HYSTERECTOMY TOTAL LAPAROSCOPIC;  Surgeon: Honor Loh Ward, MD;  Location: [facility];  Service: Gynecology;  Laterality: N/A;  . LEEP N/A 03/13/2016   Procedure: LOOP ELECTROSURGICAL EXCISION PROCEDURE (LEEP);  Surgeon: Honor Loh Ward, MD;  Location: [facility];  Service: Gynecology;  Laterality: N/A;  . SHOULDER SURGERY Right 2014   Family History  Problem Relation Age of Onset  . Multiple sclerosis Mother   . Diabetes Maternal Grandmother   . Cancer Paternal Grandmother   . COPD Paternal Grandfather   . Breast cancer Paternal Aunt        <50  . Breast cancer Cousin        first cousin <50      ALLERGIES: Patient has no known allergies.  No current outpatient medications on file prior to visit.   No current facility-administered medications on file prior to visit.    Social History   Tobacco Use  . Smoking status: Former Smoker    Packs/day: 0.50    Years: 15.00    Pack years: 7.50    Types: Cigarettes    Quit date: 03/05/2012    Years since quitting: 8.3  . Smokeless tobacco: Never Used  Vaping Use  . Vaping Use: Never used  Substance Use Topics  . Alcohol use: No  . Drug use: No    Review of Systems  Constitutional: Positive for diaphoresis. Negative for chills, fever  and unexpected weight change.  HENT: Negative for congestion.   Respiratory: Negative for cough.   Cardiovascular: Negative for chest pain, palpitations and leg swelling.  Gastrointestinal: Negative for nausea and vomiting.  Musculoskeletal: Positive for arthralgias. Negative for myalgias.  Skin: Negative for rash.  Neurological: Negative for headaches.  Hematological: Negative for adenopathy.  Psychiatric/Behavioral: Negative for confusion.      Objective:    BP 120/82 (BP Location: Left Arm, Patient Position: Sitting, Cuff Size: Large)   Pulse 73   Temp 98.1 F (36.7 C) (Oral)   Ht 5\' 6"  (1.676 m)   Wt 200 lb 3.2 oz (90.8 kg)   LMP 03/05/2013   SpO2 97%   BMI 32.31 kg/m   BP Readings  from Last 3 Encounters:  07/02/20 120/82  11/23/19 113/81  08/07/19 106/74   Wt Readings from Last 3 Encounters:  07/02/20 200 lb 3.2 oz (90.8 kg)  11/23/19 194 lb 9.6 oz (88.3 kg)  10/16/19 204 lb (92.5 kg)    Physical Exam Vitals reviewed.  Constitutional:      Appearance: She is well-developed.  Eyes:     Conjunctiva/sclera: Conjunctivae normal.  Neck:     Thyroid: No thyroid mass or thyromegaly.  Cardiovascular:     Rate and Rhythm: Normal rate and regular rhythm.     Pulses: Normal pulses.     Heart sounds: Normal heart sounds.  Pulmonary:     Effort: Pulmonary effort is normal.     Breath sounds: Normal breath sounds. No wheezing, rhonchi or rales.  Chest:     Breasts: Breasts are symmetrical.        Right: No inverted nipple, mass, nipple discharge, skin change or tenderness.        Left: No inverted nipple, mass, nipple discharge, skin change or tenderness.  Musculoskeletal:     Lumbar back: No swelling, edema, spasms, tenderness or bony tenderness. Normal range of motion.     Right hip: Tenderness present. No bony tenderness. Normal range of motion. Normal strength.     Comments: Full range of motion with flexion, tension, lateral side bends. No bony tenderness. No pain, numbness, tingling elicited with single leg raise bilaterally.  Right Hip: No limp or waddling gait. Full ROM with flexion and hip rotation in flexion.  Slight  pain of lateral hip with  (flexion-abduction-external rotation) test.   Slight pain with deep palpation of greater trochanter.    Lymphadenopathy:     Head:     Right side of head: No submental, submandibular, tonsillar, preauricular, posterior auricular or occipital adenopathy.     Left side of head: No submental, submandibular, tonsillar, preauricular, posterior auricular or occipital adenopathy.     Cervical: No cervical adenopathy.     Right cervical: No superficial, deep or posterior cervical adenopathy.    Left cervical: No  superficial, deep or posterior cervical adenopathy.  Skin:    General: Skin is warm and dry.  Neurological:     Mental Status: She is alert.     Sensory: No sensory deficit.     Deep Tendon Reflexes:     Reflex Scores:      Patellar reflexes are 2+ on the right side and 2+ on the left side.    Comments: Sensation and strength intact bilateral lower extremities.  Psychiatric:        Speech: Speech normal.        Behavior: Behavior normal.        Thought Content: Thought content  normal.        Assessment & Plan:   Problem List Items Addressed This Visit      Cardiovascular and Mediastinum   Hot flashes    Etiology nonspecific. She is not experiencing weight loss or drenching sheets in sweat. Pending thorough lab evaluation and 2 month follow up. Advised her as well to discuss with Dr Leonides Schanz in 2 weeks at f/u.         Endocrine   Diabetes mellitus without complication ([place])    Prediabetic.        Other   Right hip pain    Worsening. Symptoms c/w trochanteric bursitis. She has seen orthopedics and had cortisone injection. Unable to see records. She reports xray done. She declines imaging today in our office. She prefers to start home exercises versus start PT at this time. Close follow up.       Routine physical examination - Primary    CBE performed. Deferred pelvic exam in the absence of complaints and she follows with gyn. Declines genetic consult.       Relevant Orders   TSH   CBC with Differential/Platelet   Comprehensive metabolic panel   Hemoglobin A1c   Lipid panel   VITAMIN D 25 Hydroxy (Vit-D Deficiency, Fractures)   Hepatitis C antibody   Microalbumin / creatinine urine ratio   FSH   Prolactin       Marsh Dolly Morrell does not currently have medications on file.   No orders of the defined types were placed in this encounter.   Return precautions given.   Risks, benefits, and alternatives of the medications and treatment plan prescribed today were  discussed, and patient expressed understanding.   Education regarding symptom management and diagnosis given to patient on AVS.   Continue to follow with Burnard Hawthorne, FNP for routine health maintenance.   Marsh Dolly Fregia and I agreed with plan.   Mable Paris, FNP

## 2020-07-03 LAB — CBC WITH DIFFERENTIAL/PLATELET
Basophils Absolute: 0 10*3/uL (ref 0.0–0.2)
Basos: 0 %
EOS (ABSOLUTE): 0.1 10*3/uL (ref 0.0–0.4)
Eos: 1 %
Hematocrit: 39.5 % (ref 34.0–46.6)
Hemoglobin: 13.2 g/dL (ref 11.1–15.9)
Immature Grans (Abs): 0 10*3/uL (ref 0.0–0.1)
Immature Granulocytes: 0 %
Lymphocytes Absolute: 2.6 10*3/uL (ref 0.7–3.1)
Lymphs: 30 %
MCH: 32.7 pg (ref 26.6–33.0)
MCHC: 33.4 g/dL (ref 31.5–35.7)
MCV: 98 fL — ABNORMAL HIGH (ref 79–97)
Monocytes Absolute: 0.8 10*3/uL (ref 0.1–0.9)
Monocytes: 9 %
Neutrophils Absolute: 5.1 10*3/uL (ref 1.4–7.0)
Neutrophils: 60 %
Platelets: 334 10*3/uL (ref 150–450)
RBC: 4.04 x10E6/uL (ref 3.77–5.28)
RDW: 11.3 % — ABNORMAL LOW (ref 11.7–15.4)
WBC: 8.6 10*3/uL (ref 3.4–10.8)

## 2020-07-03 LAB — COMPREHENSIVE METABOLIC PANEL
ALT: 15 IU/L (ref 0–32)
AST: 15 IU/L (ref 0–40)
Albumin/Globulin Ratio: 1.3 (ref 1.2–2.2)
Albumin: 4.3 g/dL (ref 3.8–4.8)
Alkaline Phosphatase: 84 IU/L (ref 48–121)
BUN/Creatinine Ratio: 16 (ref 9–23)
BUN: 13 mg/dL (ref 6–24)
Bilirubin Total: 0.3 mg/dL (ref 0.0–1.2)
CO2: 24 mmol/L (ref 20–29)
Calcium: 9.8 mg/dL (ref 8.7–10.2)
Chloride: 99 mmol/L (ref 96–106)
Creatinine, Ser: 0.8 mg/dL (ref 0.57–1.00)
GFR calc Af Amer: 105 mL/min/{1.73_m2} (ref >59)
GFR calc non Af Amer: 91 mL/min/{1.73_m2} (ref >59)
Globulin, Total: 3.2 g/dL (ref 1.5–4.5)
Glucose: 150 mg/dL — ABNORMAL HIGH (ref 65–99)
Potassium: 4.5 mmol/L (ref 3.5–5.2)
Sodium: 136 mmol/L (ref 134–144)
Total Protein: 7.5 g/dL (ref 6.0–8.5)

## 2020-07-03 LAB — TSH: TSH: 1.31 u[IU]/mL (ref 0.450–4.500)

## 2020-07-03 LAB — LIPID PANEL
Chol/HDL Ratio: 5.6 ratio — ABNORMAL HIGH (ref 0.0–4.4)
Cholesterol, Total: 206 mg/dL — ABNORMAL HIGH (ref 100–199)
HDL: 37 mg/dL — ABNORMAL LOW (ref >39)
LDL Chol Calc (NIH): 141 mg/dL — ABNORMAL HIGH (ref 0–99)
Triglycerides: 155 mg/dL — ABNORMAL HIGH (ref 0–149)
VLDL Cholesterol Cal: 28 mg/dL (ref 5–40)

## 2020-07-03 LAB — PROLACTIN: Prolactin: 10.1 ng/mL (ref 4.8–23.3)

## 2020-07-03 LAB — MICROALBUMIN / CREATININE URINE RATIO
Creatinine, Urine: 43.6 mg/dL
Microalb/Creat Ratio: 17 mg/g creat (ref 0–29)
Microalbumin, Urine: 7.2 ug/mL

## 2020-07-03 LAB — FOLLICLE STIMULATING HORMONE: FSH: 4.5 m[IU]/mL

## 2020-07-03 LAB — HEMOGLOBIN A1C
Est. average glucose Bld gHb Est-mCnc: 148 mg/dL
Hgb A1c MFr Bld: 6.8 % — ABNORMAL HIGH (ref 4.8–5.6)

## 2020-07-03 LAB — VITAMIN D 25 HYDROXY (VIT D DEFICIENCY, FRACTURES): Vit D, 25-Hydroxy: 25.6 ng/mL — ABNORMAL LOW (ref 30.0–100.0)

## 2020-07-03 LAB — HEPATITIS C ANTIBODY: Hep C Virus Ab: <0.1 s/co ratio (ref 0.0–0.9)

## 2020-07-17 ENCOUNTER — Telehealth: Payer: Managed Care, Other (non HMO) | Admitting: Family

## 2020-07-24 ENCOUNTER — Encounter: Payer: Self-pay | Admitting: Family

## 2020-07-24 ENCOUNTER — Other Ambulatory Visit: Payer: Self-pay

## 2020-07-24 ENCOUNTER — Telehealth (INDEPENDENT_AMBULATORY_CARE_PROVIDER_SITE_OTHER): Payer: Managed Care, Other (non HMO) | Admitting: Family

## 2020-07-24 VITALS — Ht 66.0 in | Wt 196.0 lb

## 2020-07-24 DIAGNOSIS — E119 Type 2 diabetes mellitus without complications: Secondary | ICD-10-CM | POA: Diagnosis not present

## 2020-07-24 DIAGNOSIS — R232 Flushing: Secondary | ICD-10-CM

## 2020-07-24 DIAGNOSIS — E785 Hyperlipidemia, unspecified: Secondary | ICD-10-CM

## 2020-07-24 DIAGNOSIS — M25551 Pain in right hip: Secondary | ICD-10-CM

## 2020-07-24 MED ORDER — METFORMIN HCL ER 500 MG PO TB24: ORAL_TABLET | ORAL | 3 refills | Status: DC

## 2020-07-24 NOTE — Patient Instructions (Addendum)
Start metformin  This is  Dr. Lupita Dawn  example of a  "Low GI"  Diet:  It will allow you to lose 4 to 8  lbs  per month if you follow it carefully.  Your goal with exercise is a minimum of 30 minutes of aerobic exercise 5 days per week (Walking does not count once it becomes easy!)    All of the foods can be found at grocery stores and in bulk at [employer].  The Atkins protein bars and shakes are available in more varieties at [employer], [employer] and [place].     7 AM Breakfast:  Choose from the following:  Low carbohydrate Protein  Shakes (I recommend the  Premier Protein chocolate shakes,  EAS AdvantEdge "Carb Control" shakes  Or the Atkins shakes all are under 3 net carbs)     a scrambled egg/bacon/cheese burrito made with Mission's "carb balance" whole wheat tortilla  (about 10 net carbs )  Regulatory affairs officer (basically a quiche without the pastry crust) that is eaten cold and very convenient way to get your eggs.  8 carbs)  If you make your own protein shakes, avoid bananas and pineapple,  And use low carb greek yogurt or original /unsweetened almond or soy milk    Avoid cereal and bananas, oatmeal and cream of wheat and grits. They are loaded with carbohydrates!   10 AM: high protein snack:  Protein bar by Atkins (the snack size, under 200 cal, usually < 6 net carbs).    A stick of cheese:  Around 1 carb,  100 cal     Dannon Light n Fit [place] Yogurt  (80 cal, 8 carbs)  Other so called "protein bars" and Greek yogurts tend to be loaded with carbohydrates.  Remember, in food advertising, the word "energy" is synonymous for " carbohydrate."  Lunch:   A Sandwich using the bread choices listed, Can use any  Eggs,  lunchmeat, grilled meat or canned tuna), avocado, regular mayo/mustard  and cheese.  A Salad using blue cheese, ranch,  Goddess or vinagrette,  Avoid taco shells, croutons or "confetti" and no "candied nuts" but regular nuts OK.   No pretzels, nabs  or chips.   Pickles and miniature sweet peppers are a good low carb alternative that provide a "crunch"  The bread is the only source of carbohydrate in a sandwich and  can be decreased by trying some of the attached alternatives to traditional loaf bread   Avoid "Low fat dressings, as well as [place] dressings They are loaded with sugar!   3 PM/ Mid day  Snack:  Consider  1 ounce of  almonds, walnuts, pistachios, pecans, peanuts,  Macadamia nuts or a nut medley.  Avoid "granola and granola bars "  Mixed nuts are ok in moderation as long as there are no raisins,  cranberries or dried fruit.   KIND bars are OK if you get the low glycemic index variety   Try the prosciutto/mozzarella cheese sticks by [employer]  In deli /backery section   High protein      6 PM  Dinner:     Meat/fowl/fish with a green salad, and either broccoli, cauliflower, green beans, spinach, brussel sprouts or  Lima beans. DO NOT BREAD THE PROTEIN!!      There is a low carb pasta by Dreamfield's that is acceptable and tastes great: only 5 digestible carbs/serving.( All grocery stores but BJs carry it ) Several ready  made meals are available low carb:   Try Michel Angelo's chicken piccata or chicken or eggplant parm over low carb pasta.(Lowes and BJs)   Marjory Lies Sanchez's "Carnitas" (pulled pork, no sauce,  0 carbs) or his beef pot roast to make a dinner burrito (at [employer]J's)  Pesto over low carb pasta (bj's sells a good quality pesto in the center refrigerated section of the deli   Try satueeing  Cheral Marker with mushroooms as a good side   [employer] Giant makes a mashed cauliflower that tastes like mashed potatoes  Whole wheat pasta is still full of digestible carbs and  Not as low in glycemic index as Dreamfield's.   Brown rice is still rice,  So skip the rice and noodles if you eat [place] or [place] (or at least limit to 1/2 cup)  9 PM snack :   Breyer's "low carb" fudgsicle or  ice cream bar (Carb Smart line), or  Weight  Watcher's ice cream bar , or another "no sugar added" ice cream;  a serving of fresh berries/cherries with whipped cream   Cheese or DANNON'S LlGHT N FIT GREEK YOGURT  8 ounces of [place] unsweetened almond/cococunut milk    Treat yourself to a parfait made with whipped cream blueberiies, walnuts and vanilla greek yogurt  Avoid bananas, pineapple, grapes  and watermelon on a regular basis because they are high in sugar.  THINK OF THEM AS DESSERT  Remember that snack Substitutions should be less than 10 NET carbs per serving and meals < 20 carbs. Remember to subtract fiber grams to get the "net carbs."  @TULLOBREADPACKAGE @   Carbohydrate Counting for Diabetes Mellitus, Adult  Carbohydrate counting is a method of keeping track of how many carbohydrates you eat. Eating carbohydrates naturally increases the amount of sugar (glucose) in the blood. Counting how many carbohydrates you eat helps keep your blood glucose within normal limits, which helps you manage your diabetes (diabetes mellitus). It is important to know how many carbohydrates you can safely have in each meal. This is different for every person. A diet and nutrition specialist (registered dietitian) can help you make a meal plan and calculate how many carbohydrates you should have at each meal and snack. Carbohydrates are found in the following foods:  Grains, such as breads and cereals.  Dried beans and soy products.  Starchy vegetables, such as potatoes, peas, and corn.  Fruit and fruit juices.  Milk and yogurt.  Sweets and snack foods, such as cake, cookies, candy, chips, and soft drinks. How do I count carbohydrates? There are two ways to count carbohydrates in food. You can use either of the methods or a combination of both. Reading "Nutrition Facts" on packaged food The "Nutrition Facts" list is included on the labels of almost all packaged foods and beverages in the [place] It includes:  The serving  size.  Information about nutrients in each serving, including the grams (g) of carbohydrate per serving. To use the "Nutrition Facts":  Decide how many servings you will have.  Multiply the number of servings by the number of carbohydrates per serving.  The resulting number is the total amount of carbohydrates that you will be having. Learning standard serving sizes of other foods When you eat carbohydrate foods that are not packaged or do not include "Nutrition Facts" on the label, you need to measure the servings in order to count the amount of carbohydrates:  Measure the foods that you will eat with a food scale or  measuring cup, if needed.  Decide how many standard-size servings you will eat.  Multiply the number of servings by 15. Most carbohydrate-rich foods have about 15 g of carbohydrates per serving. ? For example, if you eat 8 oz (170 g) of strawberries, you will have eaten 2 servings and 30 g of carbohydrates (2 servings x 15 g = 30 g).  For foods that have more than one food mixed, such as soups and casseroles, you must count the carbohydrates in each food that is included. The following list contains standard serving sizes of common carbohydrate-rich foods. Each of these servings has about 15 g of carbohydrates:   hamburger bun or  English muffin.   oz (15 mL) syrup.   oz (14 g) jelly.  1 slice of bread.  1 six-inch tortilla.  3 oz (85 g) cooked rice or pasta.  4 oz (113 g) cooked dried beans.  4 oz (113 g) starchy vegetable, such as peas, corn, or potatoes.  4 oz (113 g) hot cereal.  4 oz (113 g) mashed potatoes or  of a large baked potato.  4 oz (113 g) canned or frozen fruit.  4 oz (120 mL) fruit juice.  4-6 crackers.  6 chicken nuggets.  6 oz (170 g) unsweetened dry cereal.  6 oz (170 g) plain fat-free yogurt or yogurt sweetened with artificial sweeteners.  8 oz (240 mL) milk.  8 oz (170 g) fresh fruit or one small piece of fruit.  24  oz (680 g) popped popcorn. Example of carbohydrate counting Sample meal  3 oz (85 g) chicken breast.  6 oz (170 g) brown rice.  4 oz (113 g) corn.  8 oz (240 mL) milk.  8 oz (170 g) strawberries with sugar-free whipped topping. Carbohydrate calculation 1. Identify the foods that contain carbohydrates: ? Rice. ? Corn. ? Milk. ? Strawberries. 2. Calculate how many servings you have of each food: ? 2 servings rice. ? 1 serving corn. ? 1 serving milk. ? 1 serving strawberries. 3. Multiply each number of servings by 15 g: ? 2 servings rice x 15 g = 30 g. ? 1 serving corn x 15 g = 15 g. ? 1 serving milk x 15 g = 15 g. ? 1 serving strawberries x 15 g = 15 g. 4. Add together all of the amounts to find the total grams of carbohydrates eaten: ? 30 g + 15 g + 15 g + 15 g = 75 g of carbohydrates total. Summary  Carbohydrate counting is a method of keeping track of how many carbohydrates you eat.  Eating carbohydrates naturally increases the amount of sugar (glucose) in the blood.  Counting how many carbohydrates you eat helps keep your blood glucose within normal limits, which helps you manage your diabetes.  A diet and nutrition specialist (registered dietitian) can help you make a meal plan and calculate how many carbohydrates you should have at each meal and snack. This information is not intended to replace advice given to you by your health care provider. Make sure you discuss any questions you have with your health care provider. Document Revised: 05/06/2017 Document Reviewed: 03/25/2016 Elsevier Patient Education  [place].

## 2020-07-24 NOTE — Assessment & Plan Note (Signed)
Resolved on estradiol patches  Managed by GYN

## 2020-07-24 NOTE — Assessment & Plan Note (Signed)
LDL > 70. In setting of DM, advised statin. She politely declines and would like to work on lifestyle changes.

## 2020-07-24 NOTE — Progress Notes (Signed)
Virtual Visit via Video Note  I connected with@  on 07/24/20 at 12:00 PM EDT by a video enabled telemedicine application and verified that I am speaking with the correct person using two identifiers.  Location patient: home Location provider:work  Persons participating in the virtual visit: patient, provider  I discussed the limitations of evaluation and management by telemedicine and the availability of in person appointments. The patient expressed understanding and agreed to proceed.   HPI: Feels well today No new complaints  Would like to discuss DM- new onset.   Hot flashes-completely resolved on estradiol since seeing dr ward ob  Gyn  07/16/20/   Right hip pain- improved with home exercises.    a1c 6.8   ROS: See pertinent positives and negatives per HPI.    EXAM:  VITALS per patient if applicable: BP Readings from Last 3 Encounters:  07/02/20 120/82  11/23/19 113/81  08/07/19 106/74     GENERAL: alert, oriented, appears well and in no acute distress  HEENT: atraumatic, conjunttiva clear, no obvious abnormalities on inspection of external nose and ears  NECK: normal movements of the head and neck  LUNGS: on inspection no signs of respiratory distress, breathing rate appears normal, no obvious gross SOB, gasping or wheezing  CV: no obvious cyanosis  MS: moves all visible extremities without noticeable abnormality  PSYCH/NEURO: pleasant and cooperative, no obvious depression or anxiety, speech and thought processing grossly intact  ASSESSMENT AND PLAN:  Discussed the following assessment and plan:  Problem List Items Addressed This Visit      Cardiovascular and Mediastinum   Hot flashes    Resolved on estradiol patches  Managed by GYN        Endocrine   Diabetes mellitus without complication ([place]) - Primary    New onset. Start metformin 500mg  qhs. Discussed low GI diet and provided her on her AVS. She will contact nutritionist at work through  [employer]. Follow up in 3 months for a1c, urine protein and discussion of ACE/ARB       Relevant Medications   metFORMIN (GLUCOPHAGE XR) 500 MG 24 hr tablet     Other   HLD (hyperlipidemia)    LDL > 70. In setting of DM, advised statin. She politely declines and would like to work on lifestyle changes.      Right hip pain    Improved. Declines further treatment, evaluation at this time.         -we discussed possible serious and likely etiologies, options for evaluation and workup, limitations of telemedicine visit vs in person visit, treatment, treatment risks and precautions. Pt prefers to treat via telemedicine empirically rather then risking or undertaking an in person visit at this moment.  .   I discussed the assessment and treatment plan with the patient. The patient was provided an opportunity to ask questions and all were answered. The patient agreed with the plan and demonstrated an understanding of the instructions.   The patient was advised to call back or seek an in-person evaluation if the symptoms worsen or if the condition fails to improve as anticipated.

## 2020-07-24 NOTE — Assessment & Plan Note (Signed)
Improved. Declines further treatment, evaluation at this time.

## 2020-07-24 NOTE — Assessment & Plan Note (Addendum)
New onset. Start metformin 500mg  qhs. Discussed low GI diet and provided her on her AVS. She will contact nutritionist at work through [employer]. Follow up in 3 months for a1c, urine protein and discussion of ACE/ARB

## 2020-07-28 ENCOUNTER — Encounter: Payer: Self-pay | Admitting: Family

## 2020-07-29 ENCOUNTER — Other Ambulatory Visit: Payer: Self-pay

## 2020-07-29 DIAGNOSIS — E119 Type 2 diabetes mellitus without complications: Secondary | ICD-10-CM

## 2020-07-29 MED ORDER — METFORMIN HCL ER 500 MG PO TB24: ORAL_TABLET | ORAL | 1 refills | Status: DC

## 2020-07-29 NOTE — Telephone Encounter (Signed)
Gyneth confirmed that medication should be resent to [facility] on [address]. Metformin has been sent to pharmacy.

## 2020-07-29 NOTE — Telephone Encounter (Signed)
Called [facility] on [address] to ask about Orlena's Metformin. The pharmacist states that they did not receive it and that it needs to be resent. Sent a Therapist, music to [employer] to confirm which pharmacy she wants the medication sent to.

## 2020-08-07 ENCOUNTER — Other Ambulatory Visit: Payer: Self-pay

## 2020-08-13 ENCOUNTER — Other Ambulatory Visit: Payer: Self-pay

## 2020-08-13 DIAGNOSIS — E119 Type 2 diabetes mellitus without complications: Secondary | ICD-10-CM

## 2020-08-13 MED ORDER — METFORMIN HCL ER 500 MG PO TB24: ORAL_TABLET | ORAL | 1 refills | Status: DC

## 2020-08-31 IMAGING — MG DIGITAL SCREENING BILAT W/ TOMO W/ CAD
8 series · 8 of 24 positions shown · non-contrast
Comparison: Previous exam(s).

CLINICAL DATA: Screening.

EXAM:
DIGITAL SCREENING BILATERAL MAMMOGRAM WITH TOMO AND CAD

[R MLO synth-2D]
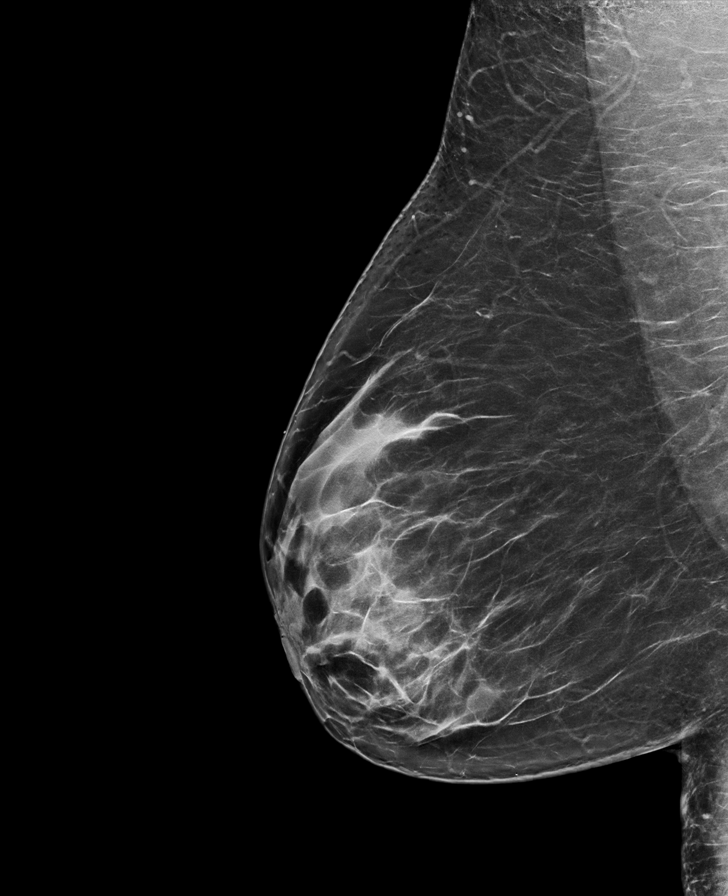

[L MLO synth-2D]
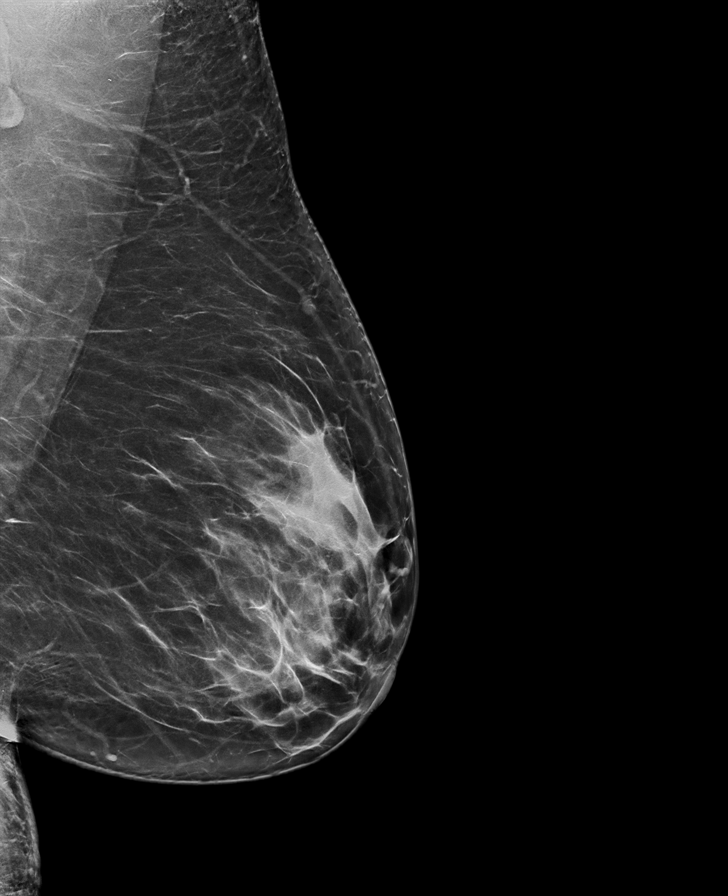

[R CC synth-2D]
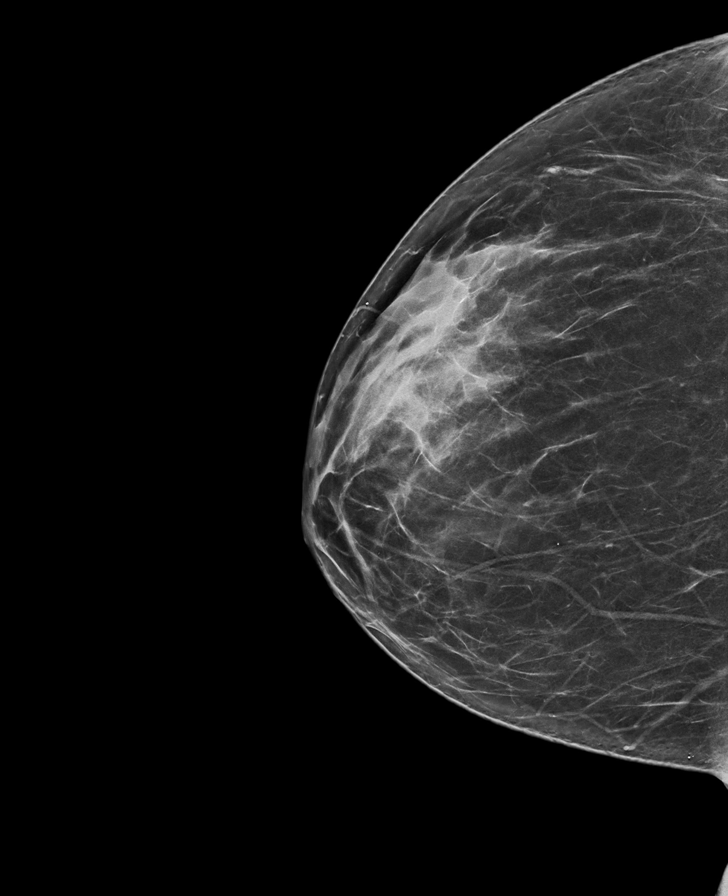

[L CC synth-2D]
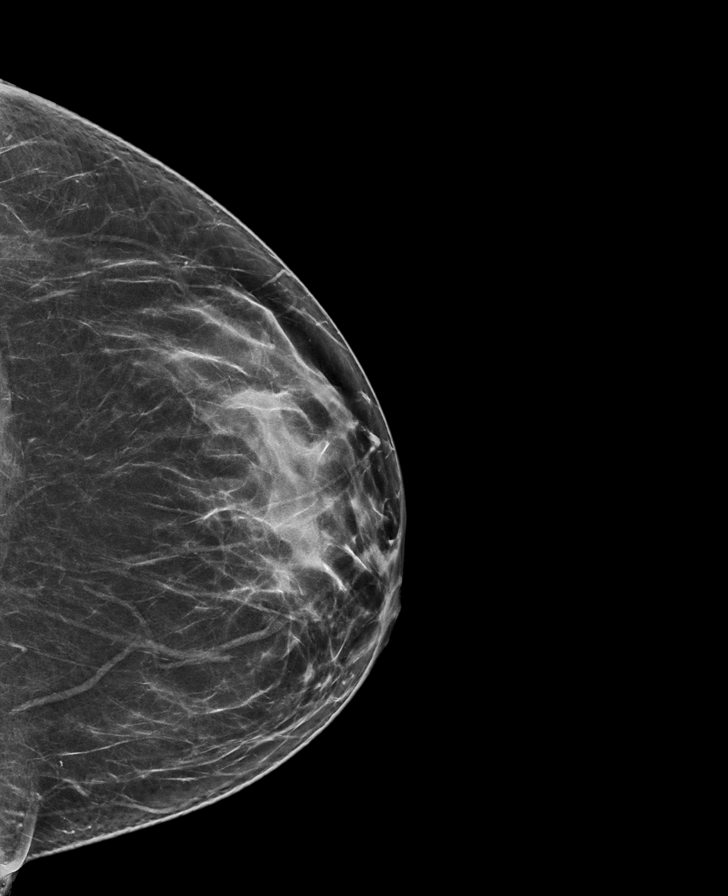

[R CC tomo · tomo slice 36/71.0]
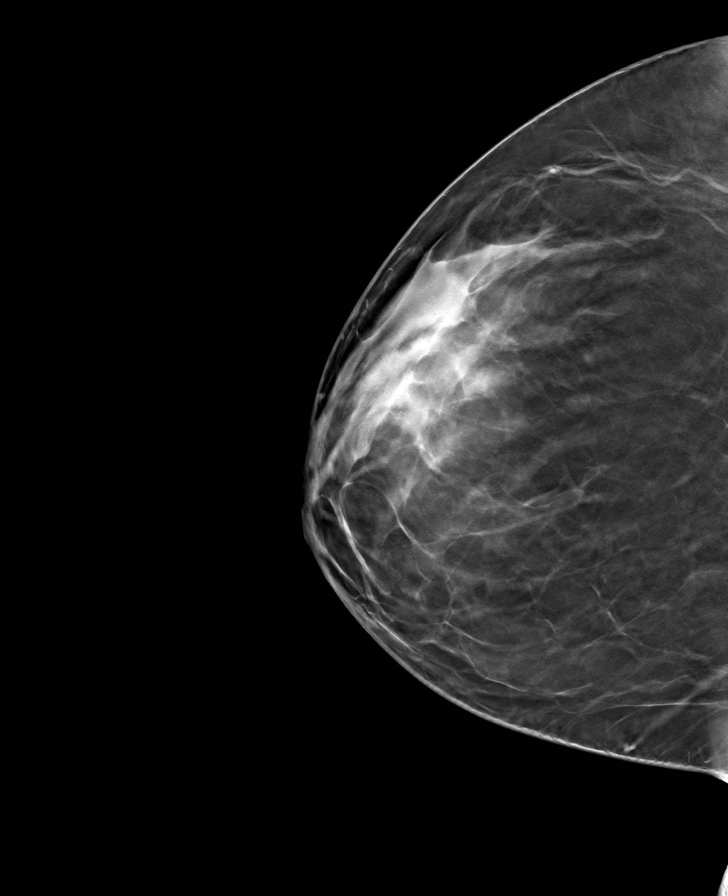

[L MLO tomo · tomo slice 39/78.0]
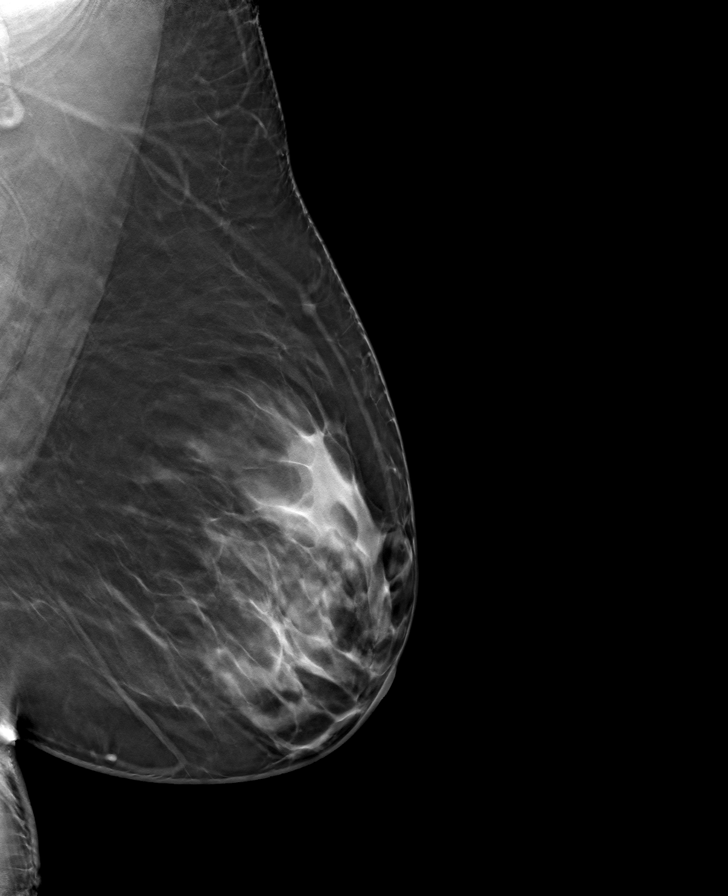

[L CC tomo · tomo slice 35/70.0]
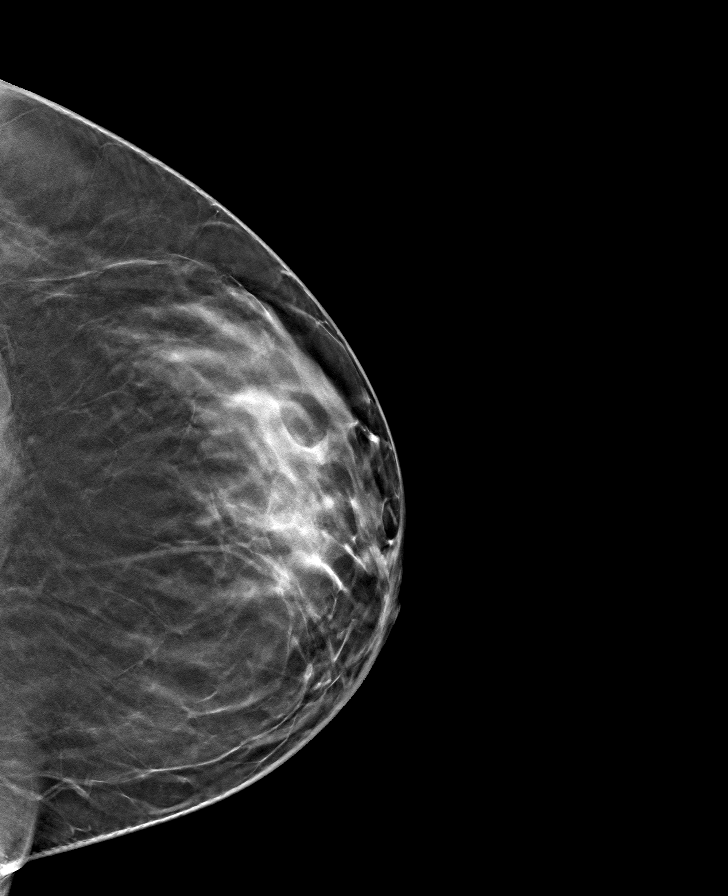

[R MLO tomo · tomo slice 39/78.0]
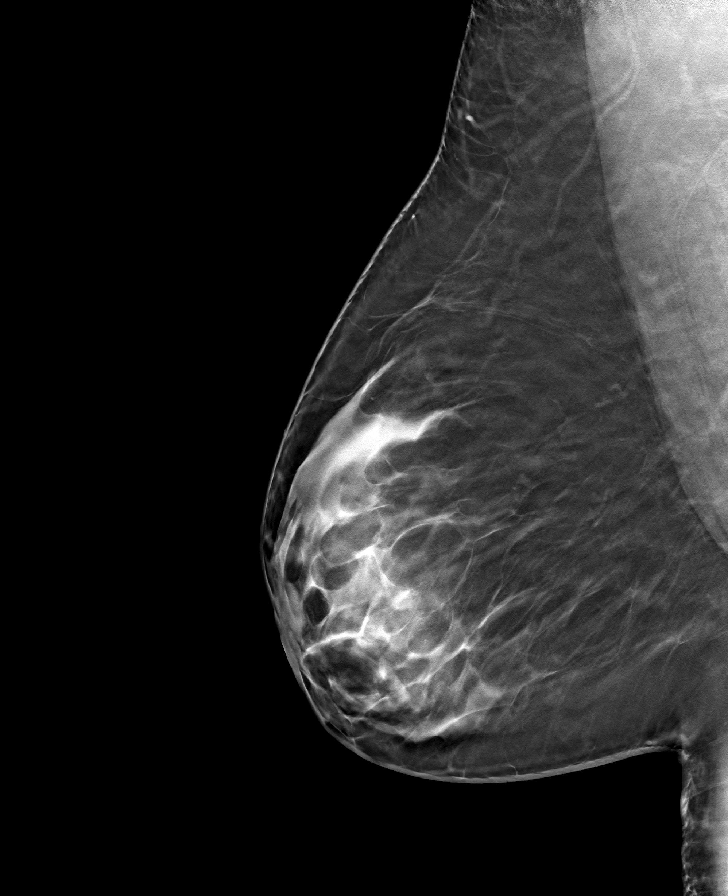

[8 of 24 positions shown; findings below may reference images not displayed]

ACR Breast Density Category b: There are scattered areas of
fibroglandular density.
FINDINGS: There are no findings suspicious for malignancy. Images were
processed with CAD.
IMPRESSION: No mammographic evidence of malignancy. A result letter of this
screening mammogram will be mailed directly to the patient.

RECOMMENDATION:
Screening mammogram in one year. (Code:[id number])

BI-RADS CATEGORY  1: Negative.

## 2020-09-02 ENCOUNTER — Ambulatory Visit: Payer: Managed Care, Other (non HMO) | Admitting: Family

## 2020-10-28 ENCOUNTER — Ambulatory Visit: Payer: Managed Care, Other (non HMO) | Admitting: Family

## 2020-12-01 ENCOUNTER — Ambulatory Visit (HOSPITAL_COMMUNITY)
Admission: EM | Admit: 2020-12-01 | Discharge: 2020-12-01 | Disposition: A | Discharge status: Home / Self Care | Payer: Managed Care, Other (non HMO) | Attending: Urgent Care | Admitting: Urgent Care

## 2020-12-01 ENCOUNTER — Encounter (HOSPITAL_COMMUNITY): Payer: Self-pay

## 2020-12-01 ENCOUNTER — Other Ambulatory Visit: Payer: Self-pay

## 2020-12-01 DIAGNOSIS — N10 Acute tubulo-interstitial nephritis: Secondary | ICD-10-CM | POA: Diagnosis not present

## 2020-12-01 DIAGNOSIS — R109 Unspecified abdominal pain: Secondary | ICD-10-CM

## 2020-12-01 LAB — POCT URINALYSIS DIPSTICK, ED / UC
Bilirubin Urine: NEGATIVE
Glucose, UA: NEGATIVE mg/dL
Ketones, ur: NEGATIVE mg/dL
Nitrite: POSITIVE — AB
Protein, ur: 30 mg/dL — AB
Specific Gravity, Urine: 1.01 (ref 1.005–1.030)
Urobilinogen, UA: 0.2 mg/dL (ref 0.0–1.0)
pH: 6.5 (ref 5.0–8.0)

## 2020-12-01 MED ORDER — ACETAMINOPHEN 325 MG PO TABS
650.0000 mg | ORAL_TABLET | Freq: Once | ORAL | Status: AC
  Administered 2020-12-01: 650 mg via ORAL

## 2020-12-01 MED ORDER — LIDOCAINE HCL (PF) 1 % IJ SOLN
INTRAMUSCULAR | Status: AC
  Filled 2020-12-01: qty 2

## 2020-12-01 MED ORDER — CEFTRIAXONE SODIUM 1 G IJ SOLR
1.0000 g | Freq: Once | INTRAMUSCULAR | Status: AC
  Administered 2020-12-01: 1 g via INTRAMUSCULAR

## 2020-12-01 MED ORDER — CEPHALEXIN 500 MG PO CAPS: 500.0000 mg | ORAL_CAPSULE | Freq: Two times a day (BID) | ORAL | 0 refills | Status: DC

## 2020-12-01 MED ORDER — ACETAMINOPHEN 325 MG PO TABS
ORAL_TABLET | ORAL | Status: AC
  Filled 2020-12-01: qty 2

## 2020-12-01 MED ORDER — CEFTRIAXONE SODIUM 1 G IJ SOLR
INTRAMUSCULAR | Status: AC
  Filled 2020-12-01: qty 10

## 2020-12-01 NOTE — ED Provider Notes (Addendum)
[place]   MRN: [id number] DOB: 1975-12-22  Subjective:   Olivia Wall is a 45 y.o. female presenting for 2-day history of acute onset right-sided flank pain/abdominal pain now having pain over the right side of her mid back.  Patient believes it was bloating and constipation, tried Gas-X without relief of her symptoms.  She did use Azo as well without relief.  Has a history of abdominal hysterectomy, bilateral salpingectomy.  No current facility-administered medications for this encounter.  Current Outpatient Medications:  .  estradiol (CLIMARA - DOSED IN MG/24 HR) 0.025 mg/24hr patch, Place 1 patch onto the skin once a week., Disp: , Rfl:  .  metFORMIN (GLUCOPHAGE XR) 500 MG 24 hr tablet, Start 500mg  PO qpm., Disp: 90 tablet, Rfl: 1   No Known Allergies  Past Medical History:  Diagnosis Date  . Abnormal Pap smear of cervix   . Cancer ([place])   . Chicken pox   . Diabetes mellitus without complication ([place])   . Ovarian cyst      Past Surgical History:  Procedure Laterality Date  . ABDOMINAL HYSTERECTOMY    . CESAREAN SECTION  1994  . LAPAROSCOPIC BILATERAL SALPINGECTOMY Bilateral 05/07/2016   Procedure: LAPAROSCOPIC BILATERAL SALPINGECTOMY;  Surgeon: Honor Loh Ward, MD;  Location: [facility];  Service: Gynecology;  Laterality: Bilateral;  . LAPAROSCOPIC HYSTERECTOMY N/A 05/07/2016   Procedure: HYSTERECTOMY TOTAL LAPAROSCOPIC;  Surgeon: Honor Loh Ward, MD;  Location: [facility];  Service: Gynecology;  Laterality: N/A;  . LEEP N/A 03/13/2016   Procedure: LOOP ELECTROSURGICAL EXCISION PROCEDURE (LEEP);  Surgeon: Honor Loh Ward, MD;  Location: [facility] OR[facility];  Service: Gynecology;  Laterality: N/A;  . SHOULDER SURGERY Right 2014    Family History  Problem Relation Age of Onset  . Multiple sclerosis Mother   . Diabetes Maternal Grandmother   . Cancer Paternal Grandmother   . COPD Paternal Grandfather   . Breast cancer Paternal Aunt        <50  . Breast cancer  Cousin        first cousin <50    Social History   Tobacco Use  . Smoking status: Former Smoker    Packs/day: 0.50    Years: 15.00    Pack years: 7.50    Types: Cigarettes    Quit date: 03/05/2012    Years since quitting: 8.7  . Smokeless tobacco: Never Used  Vaping Use  . Vaping Use: Never used  Substance Use Topics  . Alcohol use: No  . Drug use: No    ROS   Objective:   Vitals: BP 132/84   Pulse 80   Temp 98.2 F (36.8 C)   Resp 19   LMP 03/05/2013   SpO2 97%   Physical Exam Constitutional:      General: She is in acute distress (from her pain).     Appearance: Normal appearance. She is well-developed. She is not ill-appearing, toxic-appearing or diaphoretic.  HENT:     Head: Normocephalic and atraumatic.     Nose: Nose normal.     Mouth/Throat:     Mouth: Mucous membranes are moist.     Pharynx: Oropharynx is clear.  Eyes:     General: No scleral icterus.    Extraocular Movements: Extraocular movements intact.     Pupils: Pupils are equal, round, and reactive to light.  Cardiovascular:     Rate and Rhythm: Normal rate.  Pulmonary:     Effort: Pulmonary effort is normal.  Abdominal:     General: Bowel sounds are normal. There is no distension.     Palpations: Abdomen is soft. There is no mass.     Tenderness: There is abdominal tenderness (right flank side). There is right CVA tenderness. There is no left CVA tenderness, guarding or rebound.  Skin:    General: Skin is warm and dry.  Neurological:     General: No focal deficit present.     Mental Status: She is alert and oriented to person, place, and time.  Psychiatric:        Mood and Affect: Mood normal.        Behavior: Behavior normal.        Thought Content: Thought content normal.        Judgment: Judgment normal.     Results for orders placed or performed during the hospital encounter of 12/01/20 (from the past 24 hour(s))  POC Urinalysis dipstick     Status: Abnormal   Collection  Time: 12/01/20 10:49 AM  Result Value Ref Range   Glucose, UA NEGATIVE NEGATIVE mg/dL   Bilirubin Urine NEGATIVE NEGATIVE   Ketones, ur NEGATIVE NEGATIVE mg/dL   Specific Gravity, Urine 1.010 1.005 - 1.030   Hgb urine dipstick LARGE (A) NEGATIVE   pH 6.5 5.0 - 8.0   Protein, ur 30 (A) NEGATIVE mg/dL   Urobilinogen, UA 0.2 0.0 - 1.0 mg/dL   Nitrite POSITIVE (A) NEGATIVE   Leukocytes,Ua SMALL (A) NEGATIVE    Assessment and Plan :   PDMP not reviewed this encounter.  1. Acute pyelonephritis     IM ceftriaxone and PO Tylenol in clinic. Start Keflex, urine culture pending. Hydrate with plain water, schedule APAP at home. Counseled patient on potential for adverse effects with medications prescribed/recommended today, ER and return-to-clinic precautions discussed, patient verbalized understanding.    Jaynee Eagles, [place] 12/01/20 1114

## 2020-12-01 NOTE — Discharge Instructions (Addendum)
Make sure you hydrate very well with plain water and a quantity of 64 ounces of water a day.  Please limit drinks that are considered urinary irritants such as soda, sweet tea, coffee, energy drinks, alcohol.  These can worsen your kidney infection, urinary symptoms.  I will let you know about your urine culture results through MyChart to see if we need to prescribe or change your antibiotics based off of those results.  Do not use any nonsteroidal anti-inflammatories (NSAIDs) like ibuprofen, Motrin, naproxen, Aleve, etc. which are all available over-the-counter.  Please just use Tylenol at a dose of 500mg -650mg  once every 6 hours as needed for your aches, pains, fevers.

## 2020-12-01 NOTE — ED Triage Notes (Signed)
Pt in with c/o right side pain that started yesterday. Stated that yesterday pain was radiating to her abdomen  Pt has had gas-x, AZO for sxs

## 2020-12-03 LAB — URINE CULTURE: Culture: >=100000 — AB

## 2020-12-25 ENCOUNTER — Emergency Department (HOSPITAL_COMMUNITY)
Admission: EM | Admit: 2020-12-25 | Discharge: 2020-12-26 | Disposition: A | Discharge status: Home / Self Care | Payer: Managed Care, Other (non HMO) | Attending: Emergency Medicine | Admitting: Emergency Medicine

## 2020-12-25 ENCOUNTER — Encounter (HOSPITAL_COMMUNITY): Payer: Self-pay | Admitting: Emergency Medicine

## 2020-12-25 DIAGNOSIS — R109 Unspecified abdominal pain: Secondary | ICD-10-CM | POA: Diagnosis not present

## 2020-12-25 DIAGNOSIS — E119 Type 2 diabetes mellitus without complications: Secondary | ICD-10-CM | POA: Insufficient documentation

## 2020-12-25 DIAGNOSIS — Z7984 Long term (current) use of oral hypoglycemic drugs: Secondary | ICD-10-CM | POA: Insufficient documentation

## 2020-12-25 DIAGNOSIS — Z87891 Personal history of nicotine dependence: Secondary | ICD-10-CM | POA: Diagnosis not present

## 2020-12-25 DIAGNOSIS — R300 Dysuria: Secondary | ICD-10-CM | POA: Insufficient documentation

## 2020-12-25 DIAGNOSIS — R350 Frequency of micturition: Secondary | ICD-10-CM | POA: Diagnosis not present

## 2020-12-25 DIAGNOSIS — Z859 Personal history of malignant neoplasm, unspecified: Secondary | ICD-10-CM | POA: Insufficient documentation

## 2020-12-25 LAB — CBC
HCT: 38 % (ref 36.0–46.0)
Hemoglobin: 12.2 g/dL (ref 12.0–15.0)
MCH: 32.8 pg (ref 26.0–34.0)
MCHC: 32.1 g/dL (ref 30.0–36.0)
MCV: 102.2 fL — ABNORMAL HIGH (ref 80.0–100.0)
Platelets: 355 10*3/uL (ref 150–400)
RBC: 3.72 MIL/uL — ABNORMAL LOW (ref 3.87–5.11)
RDW: 11.8 % (ref 11.5–15.5)
WBC: 8.8 10*3/uL (ref 4.0–10.5)
nRBC: 0 % (ref 0.0–0.2)

## 2020-12-25 LAB — COMPREHENSIVE METABOLIC PANEL
ALT: 18 U/L (ref 0–44)
AST: 17 U/L (ref 15–41)
Albumin: 3.7 g/dL (ref 3.5–5.0)
Alkaline Phosphatase: 72 U/L (ref 38–126)
Anion gap: 9 (ref 5–15)
BUN: 8 mg/dL (ref 6–20)
CO2: 25 mmol/L (ref 22–32)
Calcium: 9.5 mg/dL (ref 8.9–10.3)
Chloride: 101 mmol/L (ref 98–111)
Creatinine, Ser: 0.65 mg/dL (ref 0.44–1.00)
GFR, Estimated: >60 mL/min (ref >60)
Glucose, Bld: 209 mg/dL — ABNORMAL HIGH (ref 70–99)
Potassium: 3.9 mmol/L (ref 3.5–5.1)
Sodium: 135 mmol/L (ref 135–145)
Total Bilirubin: 0.1 mg/dL — ABNORMAL LOW (ref 0.3–1.2)
Total Protein: 7.4 g/dL (ref 6.5–8.1)

## 2020-12-25 LAB — URINALYSIS, ROUTINE W REFLEX MICROSCOPIC
Bilirubin Urine: NEGATIVE
Glucose, UA: 150 mg/dL — AB
Ketones, ur: NEGATIVE mg/dL
Leukocytes,Ua: NEGATIVE
Nitrite: NEGATIVE
Protein, ur: NEGATIVE mg/dL
Specific Gravity, Urine: 1.01 (ref 1.005–1.030)
pH: 6 (ref 5.0–8.0)

## 2020-12-25 LAB — I-STAT BETA HCG BLOOD, ED (MC, WL, AP ONLY): I-stat hCG, quantitative: <5 m[IU]/mL (ref <5)

## 2020-12-25 LAB — LIPASE, BLOOD: Lipase: 55 U/L — ABNORMAL HIGH (ref 11–51)

## 2020-12-25 NOTE — ED Triage Notes (Signed)
Patient complains of bilateral flank pain and dysuria that started a few days ago. History of kidney infection that resolved with abx one month ago. Patient alert, oriented, and in no apparent distress at this time.

## 2020-12-26 MED ORDER — PHENAZOPYRIDINE HCL 200 MG PO TABS: 200.0000 mg | ORAL_TABLET | Freq: Three times a day (TID) | ORAL | 0 refills | Status: DC | PRN

## 2020-12-26 MED ORDER — CIPROFLOXACIN HCL 500 MG PO TABS: 500.0000 mg | ORAL_TABLET | Freq: Two times a day (BID) | ORAL | 0 refills | Status: DC

## 2020-12-26 MED ORDER — CIPROFLOXACIN HCL 500 MG PO TABS
500.0000 mg | ORAL_TABLET | Freq: Once | ORAL | Status: AC
  Administered 2020-12-26: 500 mg via ORAL
  Filled 2020-12-26: qty 1

## 2020-12-26 NOTE — ED Provider Notes (Signed)
[facility] Provider Note   CSN: [id number] Arrival date & time: 12/25/20  1801     History Chief Complaint  Patient presents with  . Dysuria    Olivia Wall is a 45 y.o. female.  The history is provided by the patient and medical records.    45 y.o. F with hx of DM, recent bout of pyelonephritis treated with keflex, here with recurrent dysuria and flank pain.  States she completed full course of antibiotics earlier in the month and felt okay for about a week but symptoms recurred again this morning.  States she has dysuria, urinary frequency, and bilateral flank pain.  States it is not as bad as it got before, but states symptoms are very similar.  She has not had any fever, nausea, or vomiting.  She has no history of kidney stones.  She did try drinking lots of water today and taking over-the-counter AZO without relief.  Past Medical History:  Diagnosis Date  . Abnormal Pap smear of cervix   . Cancer ([place])   . Chicken pox   . Diabetes mellitus without complication ([place])   . Ovarian cyst     Patient Active Problem List   Diagnosis Date Noted  . HLD (hyperlipidemia) 07/24/2020  . Routine physical examination 07/02/2020  . Hot flashes 07/02/2020  . Right hip pain 07/02/2020  . Pre-operative clearance 10/16/2019  . Diabetes mellitus without complication ([place]) 37/85/8850  . Vitamin D deficiency 07/22/2018  . ASCUS with positive high risk HPV cervical 11/09/2012    Past Surgical History:  Procedure Laterality Date  . ABDOMINAL HYSTERECTOMY    . CESAREAN SECTION  1994  . LAPAROSCOPIC BILATERAL SALPINGECTOMY Bilateral 05/07/2016   Procedure: LAPAROSCOPIC BILATERAL SALPINGECTOMY;  Surgeon: Honor Loh Ward, MD;  Location: [facility] ORS;  Service: Gynecology;  Laterality: Bilateral;  . LAPAROSCOPIC HYSTERECTOMY N/A 05/07/2016   Procedure: HYSTERECTOMY TOTAL LAPAROSCOPIC;  Surgeon: Honor Loh Ward, MD;  Location: [facility] ORS;  Service: Gynecology;   Laterality: N/A;  . LEEP N/A 03/13/2016   Procedure: LOOP ELECTROSURGICAL EXCISION PROCEDURE (LEEP);  Surgeon: Honor Loh Ward, MD;  Location: [facility] ORS;  Service: Gynecology;  Laterality: N/A;  . SHOULDER SURGERY Right 2014     OB History   No obstetric history on file.     Family History  Problem Relation Age of Onset  . Multiple sclerosis Mother   . Diabetes Maternal Grandmother   . Cancer Paternal Grandmother   . COPD Paternal Grandfather   . Breast cancer Paternal Aunt        <50  . Breast cancer Cousin        first cousin <50    Social History   Tobacco Use  . Smoking status: Former Smoker    Packs/day: 0.50    Years: 15.00    Pack years: 7.50    Types: Cigarettes    Quit date: 03/05/2012    Years since quitting: 8.8  . Smokeless tobacco: Never Used  Vaping Use  . Vaping Use: Never used  Substance Use Topics  . Alcohol use: No  . Drug use: No    Home Medications Prior to Admission medications   Medication Sig Start Date End Date Taking? Authorizing Provider  cephALEXin (KEFLEX) 500 MG capsule Take 1 capsule (500 mg total) by mouth 2 (two) times daily. 12/01/20   Jaynee Eagles, PA-C  estradiol (CLIMARA - DOSED IN MG/24 HR) 0.025 mg/24hr patch Place 1 patch onto the skin once a  week. 07/16/20   [provider]  metFORMIN (GLUCOPHAGE XR) 500 MG 24 hr tablet Start 500mg  PO qpm. 08/13/20   Burnard Hawthorne, FNP    Allergies    Patient has no known allergies.  Review of Systems   Review of Systems  Genitourinary: Positive for dysuria and frequency.  All other systems reviewed and are negative.   Physical Exam Updated Vital Signs BP 113/74 (BP Location: Right Arm)   Pulse 79   Temp 98.1 F (36.7 C) (Oral)   Resp (!) 22   LMP 03/05/2013   SpO2 97%   Physical Exam Vitals and nursing note reviewed.  Constitutional:      Appearance: She is well-developed and well-nourished.  HENT:     Head: Normocephalic and atraumatic.     Mouth/Throat:      Mouth: Oropharynx is clear and moist.  Eyes:     Extraocular Movements: EOM normal.     Conjunctiva/sclera: Conjunctivae normal.     Pupils: Pupils are equal, round, and reactive to light.  Cardiovascular:     Rate and Rhythm: Normal rate and regular rhythm.     Heart sounds: Normal heart sounds.  Pulmonary:     Effort: Pulmonary effort is normal.     Breath sounds: Normal breath sounds.  Abdominal:     General: Bowel sounds are normal.     Palpations: Abdomen is soft.     Tenderness: There is right CVA tenderness and left CVA tenderness.     Comments: Bilateral CVA tenderness, mild, no peritoneal signs  Musculoskeletal:        General: Normal range of motion.     Cervical back: Normal range of motion.  Skin:    General: Skin is warm and dry.  Neurological:     Mental Status: She is alert and oriented to person, place, and time.  Psychiatric:        Mood and Affect: Mood and affect normal.     ED Results / Procedures / Treatments   Labs (all labs ordered are listed, but only abnormal results are displayed) Labs Reviewed  LIPASE, BLOOD - Abnormal; Notable for the following components:      Result Value   Lipase 55 (*)    All other components within normal limits  COMPREHENSIVE METABOLIC PANEL - Abnormal; Notable for the following components:   Glucose, Bld 209 (*)    Total Bilirubin 0.1 (*)    All other components within normal limits  CBC - Abnormal; Notable for the following components:   RBC 3.72 (*)    MCV 102.2 (*)    All other components within normal limits  URINALYSIS, ROUTINE W REFLEX MICROSCOPIC - Abnormal; Notable for the following components:   Glucose, UA 150 (*)    Hgb urine dipstick SMALL (*)    Bacteria, UA RARE (*)    All other components within normal limits  I-STAT BETA HCG BLOOD, ED ([facility], [facility], [facility] ONLY)    EKG None  Radiology No results found.  Procedures Procedures   Medications Ordered in ED Medications  ciprofloxacin (CIPRO) tablet 500  mg (500 mg Oral Given 12/26/20 0232)    ED Course  I have reviewed the triage vital signs and the nursing notes.  Pertinent labs & imaging results that were available during my care of the patient were reviewed by me and considered in my medical decision making (see chart for details).    MDM Rules/Calculators/A&P  45 y.o. F here with bilateral  flank pain and dysuria.  Treated for bout of pyelonephritis earlier in the month, completed all abx and had recurrent symptoms starting today.  She is afebrile, non-toxic, appears quite well.  She has some mild CVA tenderness bilaterally but no peritoneal signs.  Her labs are reassuring with normal WBC count.  UA with blood, bacteria noted.  Recent culture was + for E. Coli, largely pan sensitive.  There is possibility of partially treated infection.  As she was previously on cephalosporins, will switch to fluoroquinolone and extend treatment to full 10 days.  Close follow-up with PCP.  Return here for any new/acute changes.  Final Clinical Impression(s) / ED Diagnoses Final diagnoses:  None    Rx / DC Orders ED Discharge Orders         Ordered              ciprofloxacin (CIPRO) 500 MG tablet  Every 12 hours        12/26/20 0230    phenazopyridine (PYRIDIUM) 200 MG tablet  3 times daily PRN        12/26/20 0230           Larene Pickett, PA-C 12/26/20 0257    Isla Pence, MD 12/26/20 [phone]

## 2020-12-26 NOTE — Discharge Instructions (Addendum)
Take the prescribed medication as directed.  Continue drinking lots of water.   Follow-up with your primary care doctor. Return to the ED for new or worsening symptoms.

## 2021-07-16 ENCOUNTER — Ambulatory Visit: Payer: Managed Care, Other (non HMO) | Admitting: Family

## 2021-08-13 ENCOUNTER — Ambulatory Visit: Payer: Managed Care, Other (non HMO) | Admitting: Nurse Practitioner

## 2021-08-13 ENCOUNTER — Other Ambulatory Visit: Payer: Self-pay

## 2021-08-13 ENCOUNTER — Encounter: Payer: Self-pay | Admitting: Nurse Practitioner

## 2021-08-13 VITALS — BP 120/70 | HR 78 | Temp 98.3°F | Ht 64.2 in | Wt 201.8 lb

## 2021-08-13 DIAGNOSIS — Z6834 Body mass index (BMI) 34.0-34.9, adult: Secondary | ICD-10-CM

## 2021-08-13 DIAGNOSIS — E782 Mixed hyperlipidemia: Secondary | ICD-10-CM

## 2021-08-13 DIAGNOSIS — E1169 Type 2 diabetes mellitus with other specified complication: Secondary | ICD-10-CM | POA: Diagnosis not present

## 2021-08-13 DIAGNOSIS — Z0000 Encounter for general adult medical examination without abnormal findings: Secondary | ICD-10-CM

## 2021-08-13 DIAGNOSIS — E119 Type 2 diabetes mellitus without complications: Secondary | ICD-10-CM

## 2021-08-13 DIAGNOSIS — E6609 Other obesity due to excess calories: Secondary | ICD-10-CM | POA: Diagnosis not present

## 2021-08-13 LAB — POCT UA - MICROALBUMIN
Albumin/Creatinine Ratio, Urine, POC: <30
Creatinine, POC: 100 mg/dL
Microalbumin Ur, POC: 10 mg/L

## 2021-08-13 LAB — POCT URINALYSIS DIPSTICK
Bilirubin, UA: NEGATIVE
Glucose, UA: NEGATIVE
Leukocytes, UA: NEGATIVE
Nitrite, UA: NEGATIVE
Protein, UA: NEGATIVE
Spec Grav, UA: 1.02 (ref 1.010–1.025)
Urobilinogen, UA: 0.2 E.U./dL
pH, UA: 6 (ref 5.0–8.0)

## 2021-08-13 MED ORDER — OZEMPIC (0.25 OR 0.5 MG/DOSE) 2 MG/1.5ML ~~LOC~~ SOPN
0.5000 mg | PEN_INJECTOR | SUBCUTANEOUS | 0 refills | Status: DC
Start: 2021-08-13 — End: 2021-09-23

## 2021-08-13 NOTE — Patient Instructions (Signed)
Diabetes Mellitus and Nutrition, Adult When you have diabetes, or diabetes mellitus, it is very important to have healthy eating habits because your blood sugar (glucose) levels are greatly affected by what you eat and drink. Eating healthy foods in the right amounts, at about the same times every day, can help you:  Control your blood glucose.  Lower your risk of heart disease.  Improve your blood pressure.  Reach or maintain a healthy weight. What can affect my meal plan? Every person with diabetes is different, and each person has different needs for a meal plan. Your health care provider may recommend that you work with a dietitian to make a meal plan that is best for you. Your meal plan may vary depending on factors such as:  The calories you need.  The medicines you take.  Your weight.  Your blood glucose, blood pressure, and cholesterol levels.  Your activity level.  Other health conditions you have, such as heart or kidney disease. How do carbohydrates affect me? Carbohydrates, also called carbs, affect your blood glucose level more than any other type of food. Eating carbs naturally raises the amount of glucose in your blood. Carb counting is a method for keeping track of how many carbs you eat. Counting carbs is important to keep your blood glucose at a healthy level, especially if you use insulin or take certain oral diabetes medicines. It is important to know how many carbs you can safely have in each meal. This is different for every person. Your dietitian can help you calculate how many carbs you should have at each meal and for each snack. How does alcohol affect me? Alcohol can cause a sudden decrease in blood glucose (hypoglycemia), especially if you use insulin or take certain oral diabetes medicines. Hypoglycemia can be a life-threatening condition. Symptoms of hypoglycemia, such as sleepiness, dizziness, and confusion, are similar to symptoms of having too much  alcohol.  Do not drink alcohol if: ? Your health care provider tells you not to drink. ? You are pregnant, may be pregnant, or are planning to become pregnant.  If you drink alcohol: ? Do not drink on an empty stomach. ? Limit how much you use to:  0-1 drink a day for women.  0-2 drinks a day for men. ? Be aware of how much alcohol is in your drink. In the [place] one drink equals one 12 oz bottle of beer (355 mL), one 5 oz glass of wine (148 mL), or one 1 oz glass of hard liquor (44 mL). ? Keep yourself hydrated with water, diet soda, or unsweetened iced tea.  Keep in mind that regular soda, juice, and other mixers may contain a lot of sugar and must be counted as carbs. What are tips for following this plan? Reading food labels  Start by checking the serving size on the "Nutrition Facts" label of packaged foods and drinks. The amount of calories, carbs, fats, and other nutrients listed on the label is based on one serving of the item. Many items contain more than one serving per package.  Check the total grams (g) of carbs in one serving. You can calculate the number of servings of carbs in one serving by dividing the total carbs by 15. For example, if a food has 30 g of total carbs per serving, it would be equal to 2 servings of carbs.  Check the number of grams (g) of saturated fats and trans fats in one serving. Choose foods that have   a low amount or none of these fats.  Check the number of milligrams (mg) of salt (sodium) in one serving. Most people should limit total sodium intake to less than 2,300 mg per day.  Always check the nutrition information of foods labeled as "low-fat" or "nonfat." These foods may be higher in added sugar or refined carbs and should be avoided.  Talk to your dietitian to identify your daily goals for nutrients listed on the label. Shopping  Avoid buying canned, pre-made, or processed foods. These foods tend to be high in fat, sodium, and added  sugar.  Shop around the outside edge of the grocery store. This is where you will most often find fresh fruits and vegetables, bulk grains, fresh meats, and fresh dairy. Cooking  Use low-heat cooking methods, such as baking, instead of high-heat cooking methods like deep frying.  Cook using healthy oils, such as olive, canola, or sunflower oil.  Avoid cooking with butter, cream, or high-fat meats. Meal planning  Eat meals and snacks regularly, preferably at the same times every day. Avoid going long periods of time without eating.  Eat foods that are high in fiber, such as fresh fruits, vegetables, beans, and whole grains. Talk with your dietitian about how many servings of carbs you can eat at each meal.  Eat 4-6 oz (112-168 g) of lean protein each day, such as lean meat, chicken, fish, eggs, or tofu. One ounce (oz) of lean protein is equal to: ? 1 oz (28 g) of meat, chicken, or fish. ? 1 egg. ?  cup (62 g) of tofu.  Eat some foods each day that contain healthy fats, such as avocado, nuts, seeds, and fish.   What foods should I eat? Fruits Berries. Apples. Oranges. Peaches. Apricots. Plums. Grapes. Mango. Papaya. Pomegranate. Kiwi. Cherries. Vegetables Lettuce. Spinach. Leafy greens, including kale, chard, collard greens, and mustard greens. Beets. Cauliflower. Cabbage. Broccoli. Carrots. Green beans. Tomatoes. Peppers. Onions. Cucumbers. Brussels sprouts. Grains Whole grains, such as whole-wheat or whole-grain bread, crackers, tortillas, cereal, and pasta. Unsweetened oatmeal. Quinoa. Brown or wild rice. Meats and other proteins Seafood. Poultry without skin. Lean cuts of poultry and beef. Tofu. Nuts. Seeds. Dairy Low-fat or fat-free dairy products such as milk, yogurt, and cheese. The items listed above may not be a complete list of foods and beverages you can eat. Contact a dietitian for more information. What foods should I avoid? Fruits Fruits canned with  syrup. Vegetables Canned vegetables. Frozen vegetables with butter or cream sauce. Grains Refined white flour and flour products such as bread, pasta, snack foods, and cereals. Avoid all processed foods. Meats and other proteins Fatty cuts of meat. Poultry with skin. Breaded or fried meats. Processed meat. Avoid saturated fats. Dairy Full-fat yogurt, cheese, or milk. Beverages Sweetened drinks, such as soda or iced tea. The items listed above may not be a complete list of foods and beverages you should avoid. Contact a dietitian for more information. Questions to ask a health care provider  Do I need to meet with a diabetes educator?  Do I need to meet with a dietitian?  What number can I call if I have questions?  When are the best times to check my blood glucose? Where to find more information:  American Diabetes Association: diabetes.org  Academy of Nutrition and Dietetics: www.eatright.org  National Institute of Diabetes and Digestive and Kidney Diseases: www.niddk.nih.gov  Association of Diabetes Care and Education Specialists: www.diabeteseducator.org Summary  It is important to have healthy eating   habits because your blood sugar (glucose) levels are greatly affected by what you eat and drink.  A healthy meal plan will help you control your blood glucose and maintain a healthy lifestyle.  Your health care provider may recommend that you work with a dietitian to make a meal plan that is best for you.  Keep in mind that carbohydrates (carbs) and alcohol have immediate effects on your blood glucose levels. It is important to count carbs and to use alcohol carefully. This information is not intended to replace advice given to you by your health care provider. Make sure you discuss any questions you have with your health care provider. Document Revised: 09/19/2019 Document Reviewed: 09/19/2019 Elsevier Patient Education  2021 Elsevier Inc.  

## 2021-08-13 NOTE — Progress Notes (Signed)
I,Tianna Badgett,acting as a Education administrator for [employer], NP.,have documented all relevant documentation on the behalf of [employer], NP,as directed by  Bary Castilla, NP while in the presence of Bary Castilla, NP.  This visit occurred during the SARS-CoV-2 public health emergency.  Safety protocols were in place, including screening questions prior to the visit, additional usage of staff PPE, and extensive cleaning of exam room while observing appropriate contact time as indicated for disinfecting solutions.  Subjective:     Patient ID: Olivia Wall , female    DOB: 1976-04-23 , 45 y.o.   MRN: [id number]   Chief Complaint  Patient presents with   Establish Care    HPI  Patient is here to establish care. She was last seen by PCP in 2021. She would like to get control of her diabetes.  She has taken metformin before and she did not like the way it made her feel. She works for [employer] and had her A1c taken. It was 8.3. on 06/19/21. She has had cervical cancer but denies any other cancer Denies thyroid cancer.   Her last A1C  was 8.3 on 06/19/2021 Her lipid panel was TC 215, tri 162, HDL 39, LDL 147      Past Medical History:  Diagnosis Date   Abnormal Pap smear of cervix    Cancer ([place])    Chicken pox    Diabetes mellitus without complication ([place])    Ovarian cyst      Family History  Problem Relation Age of Onset   Cancer Mother    Multiple sclerosis Mother    Breast cancer Paternal Aunt        <50   Diabetes Maternal Grandmother    Cancer Paternal Grandmother    COPD Paternal Grandfather    Breast cancer Cousin        first cousin <50     Current Outpatient Medications:    Semaglutide,0.25 or 0.5MG /DOS, (OZEMPIC, 0.25 OR 0.5 MG/DOSE,) 2 MG/1.5ML SOPN, Inject 0.5 mg into the skin once a week., Disp: 1.5 mL, Rfl: 0   No Known Allergies   Review of Systems  Constitutional: Negative.  Negative for chills and fatigue.  HENT:  Negative for congestion.    Respiratory:  Negative for cough, shortness of breath and wheezing.   Cardiovascular:  Negative for chest pain and palpitations.  Gastrointestinal: Negative.   Endocrine: Negative for polydipsia, polyphagia and polyuria.  Musculoskeletal:  Negative for arthralgias and myalgias.  Neurological: Negative.  Negative for seizures and weakness.    Today's Vitals   08/13/21 1547  BP: 120/70  Pulse: 78  Temp: 98.3 F (36.8 C)  TempSrc: Oral  Weight: 201 lb 12.8 oz (91.5 kg)  Height: 5' 4.2" (1.631 m)   Body mass index is 34.42 kg/m.  Wt Readings from Last 3 Encounters:  08/13/21 201 lb 12.8 oz (91.5 kg)  07/24/20 196 lb (88.9 kg)  07/02/20 200 lb 3.2 oz (90.8 kg)    Objective:  Physical Exam Constitutional:      Appearance: Normal appearance. She is obese.  HENT:     Head: Normocephalic and atraumatic.  Cardiovascular:     Rate and Rhythm: Normal rate and regular rhythm.     Pulses: Normal pulses.     Heart sounds: Normal heart sounds. No murmur heard. Pulmonary:     Effort: Pulmonary effort is normal. No respiratory distress.     Breath sounds: Normal breath sounds. No wheezing.  Skin:    General:  Skin is warm and dry.     Capillary Refill: Capillary refill takes less than 2 seconds.  Neurological:     Mental Status: She is alert and oriented to person, place, and time.        Assessment And Plan:     1. Encounter for medical examination to establish care --Patient is here to establish care. Martin Majestic over patient medical, family, social and surgical history. -Reviewed with patient their medications and any allergies  -Reviewed with patient their sexual orientation, drug/tobacco and alcohol use -Dicussed any new concerns with patient  -recommended patient comes in for a physical exam and complete blood work.  -Educated patient about the importance of annual screenings and immunizations.  -Advised patient to eat a healthy diet along with exercise for atleast 30-45 min  atleast 4-5 days of the week.   2. Diabetes mellitus without complication ([employer]) -Her Hgb A1c 8/25 was 8.3.  -She has had metformin in the past and she did not like the side effects.  - Hemoglobin A1c today  - POCT Urinalysis Dipstick (81002) - POCT UA - Microalbumin - Semaglutide,0.25 or 0.5MG /DOS, (OZEMPIC, 0.25 OR 0.5 MG/DOSE,) 2 MG/1.5ML SOPN; Inject 0.5 mg into the skin once a week.  Dispense: 1.5 mL; Refill: 0 -We discussed the use of ozempic to address diabetes. She confirms there is no personal/family h/o thyroid cancer. She was instructed on how to administer the medication. Possible side effects including nausea, constipation and diarrhea were discussed with the patient. Its association with medullary thyroid carcinoma was also discussed. She is reminded to stop eating when full. She will start with 0.25mg  x 4 weeks,come in for 1 month visit and then 0.5mg  x 4 weeks if she tolerates it. All questions were answered to her satisfaction. She is in agreement with her treatment plan.    3. Mixed hyperlipidemia --Educated patient about a diet that is low in fat and high fatty foods including dairy products. Increase in take of fish and fiber. Decrease intake of red meats and fast foods. Exercise for atleast 4-5 times a week or atleast 30-45 min. Drink a lot of water.   -Will check lipid panel on next visit with physical.    4. Class 1 obesity due to excess calories with serious comorbidity and body mass index (BMI) of 34.0 to 34.9 in adult Advised patient on a healthy diet including avoiding fast food and red meats. Increase the intake of lean meats including grilled chicken and [place].  Drink a lot of water. Decrease intake of fatty foods. Exercise for 30-45 min. 4-5 a week to decrease the risk of cardiac event.   The patient was encouraged to call or send a message through [place] for any questions or concerns.   Follow up: 1 month for weight check and physical.   Side effects and  appropriate use of all the medication(s) were discussed with the patient today. Patient advised to use the medication(s) as directed by their healthcare provider. The patient was encouraged to read, review, and understand all associated package inserts and contact our office with any questions or concerns. The patient accepts the risks of the treatment plan and had an opportunity to ask questions.   Staying healthy and adopting a healthy lifestyle for your overall health is important. You should eat 7 or more servings of fruits and vegetables per day. You should drink plenty of water to keep yourself hydrated and your kidneys healthy. This includes about 65-80+ fluid ounces of water. Limit  your intake of animal fats especially for elevated cholesterol. Avoid highly processed food and limit your salt intake if you have hypertension. Avoid foods high in saturated/Trans fats. Along with a healthy diet it is also very important to maintain time for yourself to maintain a healthy mental health with low stress levels. You should get atleast 150 min of moderate intensity exercise weekly for a healthy heart. Along with eating right and exercising, aim for at least 7-9 hours of sleep daily.  Eat more whole grains which includes barley, wheat berries, oats, brown rice and whole wheat pasta. Use healthy plant oils which include olive, soy, corn, sunflower and peanut. Limit your caffeine and sugary drinks. Limit your intake of fast foods. Limit milk and dairy products to one or two daily servings.   Patient was given opportunity to ask questions. Patient verbalized understanding of the plan and was able to repeat key elements of the plan. All questions were answered to their satisfaction.  Raman Uniqua Kihn, DNP   I, Raman Kennis Buell have reviewed all documentation for this visit. The documentation on 08/13/21 for the exam, diagnosis, procedures, and orders are all accurate and complete.   IF YOU HAVE BEEN REFERRED TO A  SPECIALIST, IT MAY TAKE 1-2 WEEKS TO SCHEDULE/PROCESS THE REFERRAL. IF YOU HAVE NOT HEARD FROM US/SPECIALIST IN TWO WEEKS, PLEASE GIVE [place] A CALL AT [phone] X [phone].   THE PATIENT IS ENCOURAGED TO PRACTICE SOCIAL DISTANCING DUE TO THE COVID-19 PANDEMIC.

## 2021-08-14 LAB — HEMOGLOBIN A1C
Est. average glucose Bld gHb Est-mCnc: 203 mg/dL
Hgb A1c MFr Bld: 8.7 % — ABNORMAL HIGH (ref 4.8–5.6)

## 2021-09-23 ENCOUNTER — Other Ambulatory Visit: Payer: Self-pay

## 2021-09-23 ENCOUNTER — Ambulatory Visit (INDEPENDENT_AMBULATORY_CARE_PROVIDER_SITE_OTHER): Payer: Managed Care, Other (non HMO) | Admitting: Nurse Practitioner

## 2021-09-23 ENCOUNTER — Encounter: Payer: Self-pay | Admitting: Nurse Practitioner

## 2021-09-23 VITALS — BP 118/80 | HR 86 | Temp 98.1°F | Ht 64.0 in | Wt 194.6 lb

## 2021-09-23 DIAGNOSIS — Z720 Tobacco use: Secondary | ICD-10-CM

## 2021-09-23 DIAGNOSIS — E1169 Type 2 diabetes mellitus with other specified complication: Secondary | ICD-10-CM | POA: Diagnosis not present

## 2021-09-23 DIAGNOSIS — E782 Mixed hyperlipidemia: Secondary | ICD-10-CM | POA: Diagnosis not present

## 2021-09-23 DIAGNOSIS — Z0000 Encounter for general adult medical examination without abnormal findings: Secondary | ICD-10-CM

## 2021-09-23 DIAGNOSIS — Z23 Encounter for immunization: Secondary | ICD-10-CM

## 2021-09-23 DIAGNOSIS — Z6834 Body mass index (BMI) 34.0-34.9, adult: Secondary | ICD-10-CM

## 2021-09-23 DIAGNOSIS — E6609 Other obesity due to excess calories: Secondary | ICD-10-CM

## 2021-09-23 MED ORDER — OZEMPIC (1 MG/DOSE) 4 MG/3ML ~~LOC~~ SOPN: 1.0000 mg | PEN_INJECTOR | SUBCUTANEOUS | 1 refills | Status: DC

## 2021-09-23 NOTE — Progress Notes (Signed)
I,Olivia Wall,acting as a Education administrator for [employer], NP.,have documented all relevant documentation on the behalf of [employer], NP,as directed by  Olivia Castilla, NP while in the presence of Olivia Castilla, NP.  This visit occurred during the SARS-CoV-2 public health emergency.  Safety protocols were in place, including screening questions prior to the visit, additional usage of staff PPE, and extensive cleaning of exam room while observing appropriate contact time as indicated for disinfecting solutions.  Subjective:     Patient ID: Olivia Wall , female    DOB: 1976/09/10 , 45 y.o.   MRN: [id number]   Chief Complaint  Patient presents with   Annual Exam    HPI  Pt here for HM. She has no concerns or questions at this time. She had a pap smear in 2020.  Diet: She has cut down on a lot of things. She doesn't drink a lot of coffee. No bread. Her BS is 140-150s.  Exercise: she goes on her treadmill. She goes on it everyday for an hour.     Past Medical History:  Diagnosis Date   Abnormal Pap smear of cervix    Cancer ([place])    Chicken pox    Diabetes mellitus without complication ([place])    Ovarian cyst      Family History  Problem Relation Age of Onset   Cancer Mother    Multiple sclerosis Mother    Breast cancer Paternal Aunt        <50   Diabetes Maternal Grandmother    Cancer Paternal Grandmother    COPD Paternal Grandfather    Breast cancer Cousin        first cousin <50     Current Outpatient Medications:    Semaglutide, 1 MG/DOSE, (OZEMPIC, 1 MG/DOSE,) 4 MG/3ML SOPN, Inject 1 mg into the skin once a week., Disp: 3 mL, Rfl: 1   No Known Allergies    The patient states she uses none for birth control. Last LMP was Patient's last menstrual period was 03/05/2013.. Negative for Dysmenorrhea. Negative for: breast discharge, breast lump(s), breast pain and breast self exam. Associated symptoms include abnormal vaginal bleeding. Pertinent  negatives include abnormal bleeding (hematology), anxiety, decreased libido, depression, difficulty falling sleep, dyspareunia, history of infertility, nocturia, sexual dysfunction, sleep disturbances, urinary incontinence, urinary urgency, vaginal discharge and vaginal itching. Diet regular.The patient states her exercise level is    . The patient's tobacco use is:  Social History   Tobacco Use  Smoking Status Former   Packs/day: 0.50   Years: 15.00   Pack years: 7.50   Types: Cigarettes   Quit date: 03/05/2012   Years since quitting: 9.5  Smokeless Tobacco Never  . She has been exposed to passive smoke. The patient's alcohol use is:  Social History   Substance and Sexual Activity  Alcohol Use No  . Additional information: Last pap 2020, next one scheduled for 2023.    Review of Systems  Constitutional: Negative.  Negative for chills, fatigue and fever.  HENT:  Negative for sinus pain, sneezing and sore throat.   Respiratory: Negative.  Negative for cough and wheezing.   Cardiovascular: Negative.  Negative for chest pain and palpitations.  Gastrointestinal: Negative.  Negative for constipation and diarrhea.  Endocrine: Negative for polydipsia, polyphagia and polyuria.  Musculoskeletal:  Negative for arthralgias and myalgias.  Neurological: Negative.  Negative for dizziness, weakness and headaches.  Psychiatric/Behavioral: Negative.      Today's Vitals   09/23/21 1502  BP: 118/80  Pulse: 86  Temp: 98.1 F (36.7 C)  Weight: 194 lb 9.6 oz (88.3 kg)  Height: '5\' 4"'  (1.626 m)   Body mass index is 33.4 kg/m.  Wt Readings from Last 3 Encounters:  09/23/21 194 lb 9.6 oz (88.3 kg)  08/13/21 201 lb 12.8 oz (91.5 kg)  07/24/20 196 lb (88.9 kg)    Objective:  Physical Exam Vitals and nursing note reviewed.  Constitutional:      Appearance: Normal appearance.  HENT:     Head: Normocephalic and atraumatic.     Right Ear: Tympanic membrane, ear canal and external ear normal.  There is no impacted cerumen.     Left Ear: Tympanic membrane, ear canal and external ear normal. There is no impacted cerumen.     Nose: Nose normal. No congestion or rhinorrhea.     Mouth/Throat:     Mouth: Mucous membranes are moist.     Pharynx: Oropharynx is clear.  Eyes:     Extraocular Movements: Extraocular movements intact.     Conjunctiva/sclera: Conjunctivae normal.     Pupils: Pupils are equal, round, and reactive to light.  Cardiovascular:     Rate and Rhythm: Normal rate and regular rhythm.     Pulses: Normal pulses.     Heart sounds: Normal heart sounds.  Pulmonary:     Effort: Pulmonary effort is normal. No respiratory distress.     Breath sounds: Normal breath sounds. No wheezing.  Chest:     Chest wall: No mass or tenderness.  Breasts:    Tanner Score is 5.     Right: Normal.     Left: Normal.  Abdominal:     General: Abdomen is flat. Bowel sounds are normal.     Palpations: Abdomen is soft.  Genitourinary:    Comments: deferred Musculoskeletal:        General: Normal range of motion.     Cervical back: Normal range of motion and neck supple.  Skin:    General: Skin is warm and dry.     Capillary Refill: Capillary refill takes less than 2 seconds.  Neurological:     General: No focal deficit present.     Mental Status: She is alert and oriented to person, place, and time.  Psychiatric:        Mood and Affect: Mood normal.        Behavior: Behavior normal.     Assessment And Plan:     1. Routine physical examination --Patient is here for their annual physical exam and we discussed any changes to medication and medical history.  -Behavior modification was discussed as well as diet and exercise history  -Patient will continue to exercise regularly and modify their diet.  -Recommendation for yearly physical annuals, immunization and screenings including mammogram and colonoscopy were discussed with the patient.  -Recommended intake of multivitamin,  vitamin D and calcium.  -Individualized advise was given to the patient pertaining to their own health history in regards to diet, exercise, medical condition and referrals.  - MM Digital Screening; Future  2. Type 2 diabetes mellitus with morbid obesity ([place]) --Discussed with patient the importance of glycemic control and long term complications from uncontrolled diabetes. Discussed with the patient the importance of compliance with home glucose monitoring, diet which includes decrease amount of sugary drinks and foods. Importance of exercise was also discussed with the patient. Importance of eye exams, self foot care and compliance to office visits was also discussed with  the patient.  -Continue Ozempic will increase to 1 mg.  - CMP14+EGFR - CBC - Hemoglobin A1c - Semaglutide, 1 MG/DOSE, (OZEMPIC, 1 MG/DOSE,) 4 MG/3ML SOPN; Inject 1 mg into the skin once a week.  Dispense: 3 mL; Refill: 1  3. Mixed hyperlipidemia --Educated patient about a diet that is low in fat and high fatty foods including dairy products. Increase in take of fish and fiber. Decrease intake of red meats and fast foods. Exercise for atleast 4-5 times a week or atleast 30-45 min. Drink a lot of water.   - Lipid panel  4. Need for Tdap vaccination - Tdap vaccine greater than or equal to 7yo IM  5. Class 1 obesity due to excess calories with serious comorbidity and body mass index (BMI) of 34.0 to 34.9 in adult -Congratulated on her weight loss of 7 pounds  -Advised patient on a healthy diet including avoiding fast food and red meats. Increase the intake of lean meats including grilled chicken and [place].  Drink a lot of water. Decrease intake of fatty foods. Exercise for 30-45 min. 4-5 a week to decrease the risk of cardiac event.   The patient was encouraged to call or send a message through [facility] for any questions or concerns.   Follow up: if symptoms persist or do not get better.   Side effects and appropriate use  of all the medication(s) were discussed with the patient today. Patient advised to use the medication(s) as directed by their healthcare provider. The patient was encouraged to read, review, and understand all associated package inserts and contact our office with any questions or concerns. The patient accepts the risks of the treatment plan and had an opportunity to ask questions.   Staying healthy and adopting a healthy lifestyle for your overall health is important. You should eat 7 or more servings of fruits and vegetables per day. You should drink plenty of water to keep yourself hydrated and your kidneys healthy. This includes about 65-80+ fluid ounces of water. Limit your intake of animal fats especially for elevated cholesterol. Avoid highly processed food and limit your salt intake if you have hypertension. Avoid foods high in saturated/Trans fats. Along with a healthy diet it is also very important to maintain time for yourself to maintain a healthy mental health with low stress levels. You should get atleast 150 min of moderate intensity exercise weekly for a healthy heart. Along with eating right and exercising, aim for at least 7-9 hours of sleep daily.  Eat more whole grains which includes barley, wheat berries, oats, brown rice and whole wheat pasta. Use healthy plant oils which include olive, soy, corn, sunflower and peanut. Limit your caffeine and sugary drinks. Limit your intake of fast foods. Limit milk and dairy products to one or two daily servings.   Patient was given opportunity to ask questions. Patient verbalized understanding of the plan and was able to repeat key elements of the plan. All questions were answered to their satisfaction.  Raman Artha Chiasson, DNP   I, Raman Lyndal Alamillo have reviewed all documentation for this visit. The documentation on 09/23/21 for the exam, diagnosis, procedures, and orders are all accurate and complete.   THE PATIENT IS ENCOURAGED TO PRACTICE SOCIAL  DISTANCING DUE TO THE COVID-19 PANDEMIC.

## 2021-09-23 NOTE — Patient Instructions (Signed)

## 2021-09-24 LAB — CBC
Hematocrit: 37.4 % (ref 34.0–46.6)
Hemoglobin: 12.8 g/dL (ref 11.1–15.9)
MCH: 32.8 pg (ref 26.6–33.0)
MCHC: 34.2 g/dL (ref 31.5–35.7)
MCV: 96 fL (ref 79–97)
Platelets: 364 10*3/uL (ref 150–450)
RBC: 3.9 x10E6/uL (ref 3.77–5.28)
RDW: 11.3 % — ABNORMAL LOW (ref 11.7–15.4)
WBC: 13.4 10*3/uL — ABNORMAL HIGH (ref 3.4–10.8)

## 2021-09-24 LAB — HEMOGLOBIN A1C
Est. average glucose Bld gHb Est-mCnc: 154 mg/dL
Hgb A1c MFr Bld: 7 % — ABNORMAL HIGH (ref 4.8–5.6)

## 2021-09-24 LAB — CMP14+EGFR
ALT: 16 IU/L (ref 0–32)
AST: 13 IU/L (ref 0–40)
Albumin/Globulin Ratio: 1.7 (ref 1.2–2.2)
Albumin: 4.5 g/dL (ref 3.8–4.8)
Alkaline Phosphatase: 89 IU/L (ref 44–121)
BUN/Creatinine Ratio: 14 (ref 9–23)
BUN: 10 mg/dL (ref 6–24)
Bilirubin Total: 0.2 mg/dL (ref 0.0–1.2)
CO2: 24 mmol/L (ref 20–29)
Calcium: 9.8 mg/dL (ref 8.7–10.2)
Chloride: 97 mmol/L (ref 96–106)
Creatinine, Ser: 0.74 mg/dL (ref 0.57–1.00)
Globulin, Total: 2.7 g/dL (ref 1.5–4.5)
Glucose: 100 mg/dL — ABNORMAL HIGH (ref 70–99)
Potassium: 4.3 mmol/L (ref 3.5–5.2)
Sodium: 136 mmol/L (ref 134–144)
Total Protein: 7.2 g/dL (ref 6.0–8.5)
eGFR: 103 mL/min/{1.73_m2} (ref >59)

## 2021-09-24 LAB — LIPID PANEL
Chol/HDL Ratio: 5.2 ratio — ABNORMAL HIGH (ref 0.0–4.4)
Cholesterol, Total: 188 mg/dL (ref 100–199)
HDL: 36 mg/dL — ABNORMAL LOW (ref >39)
LDL Chol Calc (NIH): 123 mg/dL — ABNORMAL HIGH (ref 0–99)
Triglycerides: 163 mg/dL — ABNORMAL HIGH (ref 0–149)
VLDL Cholesterol Cal: 29 mg/dL (ref 5–40)

## 2021-09-30 ENCOUNTER — Other Ambulatory Visit: Payer: Self-pay | Admitting: Nurse Practitioner

## 2021-09-30 DIAGNOSIS — Z1231 Encounter for screening mammogram for malignant neoplasm of breast: Secondary | ICD-10-CM

## 2021-10-30 ENCOUNTER — Other Ambulatory Visit: Payer: Self-pay

## 2021-10-30 ENCOUNTER — Ambulatory Visit
Admission: RE | Admit: 2021-10-30 | Discharge: 2021-10-30 | Disposition: A | Discharge status: Home / Self Care | Payer: Managed Care, Other (non HMO) | Source: Ambulatory Visit | Attending: Nurse Practitioner | Admitting: Nurse Practitioner

## 2021-10-30 DIAGNOSIS — Z1231 Encounter for screening mammogram for malignant neoplasm of breast: Secondary | ICD-10-CM

## 2021-11-24 ENCOUNTER — Other Ambulatory Visit: Payer: Self-pay

## 2021-11-24 DIAGNOSIS — E1169 Type 2 diabetes mellitus with other specified complication: Secondary | ICD-10-CM

## 2021-11-24 MED ORDER — OZEMPIC (1 MG/DOSE) 4 MG/3ML ~~LOC~~ SOPN: 1.0000 mg | PEN_INJECTOR | SUBCUTANEOUS | 1 refills | Status: DC

## 2021-12-06 LAB — HM DIABETES EYE EXAM

## 2022-01-13 ENCOUNTER — Encounter: Payer: Self-pay | Admitting: Nurse Practitioner

## 2022-01-13 ENCOUNTER — Ambulatory Visit (INDEPENDENT_AMBULATORY_CARE_PROVIDER_SITE_OTHER): Payer: Managed Care, Other (non HMO) | Admitting: Nurse Practitioner

## 2022-01-13 ENCOUNTER — Other Ambulatory Visit: Payer: Self-pay

## 2022-01-13 VITALS — BP 114/70 | HR 88 | Temp 98.0°F | Ht 64.0 in | Wt 183.6 lb

## 2022-01-13 DIAGNOSIS — E559 Vitamin D deficiency, unspecified: Secondary | ICD-10-CM | POA: Diagnosis not present

## 2022-01-13 DIAGNOSIS — Z6831 Body mass index (BMI) 31.0-31.9, adult: Secondary | ICD-10-CM

## 2022-01-13 DIAGNOSIS — Z87410 Personal history of cervical dysplasia: Secondary | ICD-10-CM

## 2022-01-13 DIAGNOSIS — E6609 Other obesity due to excess calories: Secondary | ICD-10-CM

## 2022-01-13 DIAGNOSIS — E1169 Type 2 diabetes mellitus with other specified complication: Secondary | ICD-10-CM

## 2022-01-13 NOTE — Patient Instructions (Signed)

## 2022-01-13 NOTE — Progress Notes (Signed)
?This visit occurred during the SARS-CoV-2 public health emergency.  Safety protocols were in place, including screening questions prior to the visit, additional usage of staff PPE, and extensive cleaning of exam room while observing appropriate contact time as indicated for disinfecting solutions. ? ?Subjective:  ?  ? Patient ID: Olivia Wall , female    DOB: May 21, 1976 , 46 y.o.   MRN: [id number] ? ? ?Chief Complaint  ?Patient presents with  ? Obesity  ? ? ?HPI ? ?Pt here for diabetes follow up. She has tried metformin that made her sick to her stomach.  She is on Ozempic 49m weekly.  She has been taking fiber supplements. She notices she is not eating as many sweets. She is exercising 3-4 days a week for one hour.  She has had cervical and ovarian cancer. She does not have a GYN.  ? ? ? ?Diabetes ?She presents for her follow-up diabetic visit. She has type 2 diabetes mellitus. There are no hypoglycemic associated symptoms. There are no diabetic associated symptoms. Risk factors for coronary artery disease include diabetes mellitus and obesity. Current diabetic treatment includes oral agent (monotherapy). (Last blood sugar was 150 after eating. She could feel her blood sugar was high.)   ? ?Past Medical History:  ?Diagnosis Date  ? Abnormal Pap smear of cervix   ? [facility]   ? Chicken pox   ? [place]   ? Ovarian cyst   ?  ? ?Family History  ?Problem Relation Age of Onset  ? Cancer Mother   ? Multiple sclerosis Mother   ? Breast cancer Paternal Aunt   ?     <50  ? Diabetes Maternal Grandmother   ? Cancer Paternal Grandmother   ? COPD Paternal Grandfather   ? Breast cancer Cousin   ?     first cousin <50  ? ? ? ?Current Outpatient Medications:  ?  Semaglutide, 1 MG/DOSE, (OZEMPIC, 1 MG/DOSE,) 4 MG/3ML SOPN, Inject 1 mg into the skin once a week., Disp: 3 mL, Rfl: 1  ? ?No Known Allergies  ? ?Review of Systems  ?Constitutional: Negative.   ?Respiratory: Negative.     ?Cardiovascular: Negative.   ?Neurological: Negative.   ?Psychiatric/Behavioral: Negative.     ? ?Today's Vitals  ? 01/13/22 0827  ?BP: 114/70  ?Pulse: 88  ?Temp: 98 ?F (36.7 ?C)  ?Weight: 183 lb 9.6 oz (83.3 kg)  ?Height: _0  (1.626 m)  ? ?Body mass index is 31.51 kg/m?.  ?Wt Readings from Last 3 Encounters:  ?01/13/22 183 lb 9.6 oz (83.3 kg)  ?09/23/21 194 lb 9.6 oz (88.3 kg)  ?08/13/21 201 lb 12.8 oz (91.5 kg)  ?  ?BP Readings from Last 3 Encounters:  ?01/13/22 114/70  ?09/23/21 118/80  ?08/13/21 120/70  ?. ?Objective:  ?Physical Exam ?Vitals reviewed.  ?Constitutional:   ?   General: She is not in acute distress. ?   Appearance: Normal appearance.  ?Cardiovascular:  ?   Rate and Rhythm: Normal rate and regular rhythm.  ?   Pulses: Normal pulses.  ?   Heart sounds: Normal heart sounds. No murmur heard. ?Pulmonary:  ?   Effort: Pulmonary effort is normal. No respiratory distress.  ?   Breath sounds: Normal breath sounds. No wheezing.  ?Skin: ?   Capillary Refill: Capillary refill takes less than 2 seconds.  ?Neurological:  ?   General: No focal deficit present.  ?   Mental Status: She is alert and oriented  to person, place, and time.  ?   Cranial Nerves: No cranial nerve deficit.  ?   Motor: No weakness.  ?Psychiatric:     ?   Mood and Affect: Mood normal.     ?   Behavior: Behavior normal.     ?   Thought Content: Thought content normal.     ?   Judgment: Judgment normal.  ?  ? ?   ?Assessment And Plan:  ?   ?1. Type 2 diabetes mellitus with obesity ([place]) ?Comments: HgbA1c improved at last visit, tolerating Ozempic well. Will increase dose pending results.  ?- Hemoglobin A1c ? ?2. Vitamin D deficiency ?Comments: No current supplements, will check levels. ?- VITAMIN D 25 Hydroxy (Vit-D Deficiency, Fractures) ?- BMP8+eGFR ? ?3. Class 1 obesity due to excess calories with serious comorbidity and body mass index (BMI) of 31.0 to 31.9 in adult ?She is encouraged to strive for BMI less than 30 to decrease cardiac  risk. Advised to aim for at least 150 minutes of exercise per week.  Continue with healthy diet low in sugar.  ? ?4. History of cervical dysplasia ?Comments: Had hysterectomy in 2017 ? ? ? ?Patient was given opportunity to ask questions. Patient verbalized understanding of the plan and was able to repeat key elements of the plan. All questions were answered to their satisfaction.  ?Minette Brine, FNP  ? ?I, Minette Brine, FNP, have reviewed all documentation for this visit. The documentation on 01/13/22 for the exam, diagnosis, procedures, and orders are all accurate and complete.  ? ?IF YOU HAVE BEEN REFERRED TO A SPECIALIST, IT MAY TAKE 1-2 WEEKS TO SCHEDULE/PROCESS THE REFERRAL. IF YOU HAVE NOT HEARD FROM US/SPECIALIST IN TWO WEEKS, PLEASE GIVE [place] A CALL AT [phone].  ? ?THE PATIENT IS ENCOURAGED TO PRACTICE SOCIAL DISTANCING DUE TO THE COVID-19 PANDEMIC.   ?

## 2022-01-14 LAB — VITAMIN D 25 HYDROXY (VIT D DEFICIENCY, FRACTURES): Vit D, 25-Hydroxy: 23.2 ng/mL — ABNORMAL LOW (ref 30.0–100.0)

## 2022-01-14 LAB — HEMOGLOBIN A1C
Est. average glucose Bld gHb Est-mCnc: 126 mg/dL
Hgb A1c MFr Bld: 6 % — ABNORMAL HIGH (ref 4.8–5.6)

## 2022-01-14 LAB — BMP8+EGFR
BUN/Creatinine Ratio: 14 (ref 9–23)
BUN: 12 mg/dL (ref 6–24)
CO2: 23 mmol/L (ref 20–29)
Calcium: 10.2 mg/dL (ref 8.7–10.2)
Chloride: 101 mmol/L (ref 96–106)
Creatinine, Ser: 0.83 mg/dL (ref 0.57–1.00)
Glucose: 111 mg/dL — ABNORMAL HIGH (ref 70–99)
Potassium: 4.8 mmol/L (ref 3.5–5.2)
Sodium: 138 mmol/L (ref 134–144)
eGFR: 89 mL/min/{1.73_m2} (ref >59)

## 2022-01-25 ENCOUNTER — Other Ambulatory Visit: Payer: Self-pay | Admitting: Nurse Practitioner

## 2022-01-29 ENCOUNTER — Encounter: Payer: Self-pay | Admitting: Nurse Practitioner

## 2022-01-29 ENCOUNTER — Other Ambulatory Visit: Payer: Self-pay

## 2022-01-29 MED ORDER — OZEMPIC (1 MG/DOSE) 4 MG/3ML ~~LOC~~ SOPN: 1.0000 mg | PEN_INJECTOR | SUBCUTANEOUS | 1 refills | Status: DC

## 2022-02-25 ENCOUNTER — Other Ambulatory Visit: Payer: Self-pay

## 2022-02-25 MED ORDER — OZEMPIC (1 MG/DOSE) 4 MG/3ML ~~LOC~~ SOPN: 1.0000 mg | PEN_INJECTOR | SUBCUTANEOUS | 1 refills | Status: DC

## 2022-03-18 ENCOUNTER — Ambulatory Visit: Payer: Managed Care, Other (non HMO) | Admitting: Nurse Practitioner

## 2022-04-20 ENCOUNTER — Encounter: Payer: Self-pay | Admitting: Nurse Practitioner

## 2022-04-20 ENCOUNTER — Ambulatory Visit (INDEPENDENT_AMBULATORY_CARE_PROVIDER_SITE_OTHER): Payer: Managed Care, Other (non HMO) | Admitting: Nurse Practitioner

## 2022-04-20 VITALS — BP 122/68 | HR 88 | Temp 98.0°F | Ht 65.0 in | Wt 178.0 lb

## 2022-04-20 DIAGNOSIS — E1169 Type 2 diabetes mellitus with other specified complication: Secondary | ICD-10-CM | POA: Diagnosis not present

## 2022-04-20 DIAGNOSIS — E782 Mixed hyperlipidemia: Secondary | ICD-10-CM

## 2022-04-20 DIAGNOSIS — Z6830 Body mass index (BMI) 30.0-30.9, adult: Secondary | ICD-10-CM

## 2022-04-20 DIAGNOSIS — E669 Obesity, unspecified: Secondary | ICD-10-CM

## 2022-04-20 DIAGNOSIS — E6609 Other obesity due to excess calories: Secondary | ICD-10-CM | POA: Diagnosis not present

## 2022-04-20 MED ORDER — GVOKE HYPOPEN 2-PACK 0.5 MG/0.1ML ~~LOC~~ SOAJ: 1.0000 | SUBCUTANEOUS | 3 refills | Status: DC | PRN

## 2022-04-20 MED ORDER — ATORVASTATIN CALCIUM 10 MG PO TABS: ORAL_TABLET | ORAL | 2 refills | Status: DC

## 2022-04-20 NOTE — Progress Notes (Unsigned)
I,Tianna Badgett,acting as a Education administrator for [employer], FNP.,have documented all relevant documentation on the behalf of Minette Brine, FNP,as directed by  Minette Brine, FNP while in the presence of Minette Brine, [place].  Subjective:     Patient ID: Olivia Wall , female    DOB: 1976/05/26 , 46 y.o.   MRN: [id number]   Chief Complaint  Patient presents with   Diabetes    HPI  Pt here for diabetes follow up. She has taken metformin in the past with prediabetes.   Wt Readings from Last 3 Encounters: 04/20/22 : 178 lb (80.7 kg) 01/13/22 : 183 lb 9.6 oz (83.3 kg) 09/23/21 : 194 lb 9.6 oz (88.3 kg)    Diabetes She presents for her follow-up diabetic visit. She has type 2 diabetes mellitus. There are no hypoglycemic associated symptoms. There are no diabetic associated symptoms. There are no diabetic complications. Risk factors for coronary artery disease include diabetes mellitus and obesity. Current diabetic treatment includes oral agent (monotherapy). (She is checking her blood sugar averaging 170 after 2 hours of eating. In am 99 upon awakening she feels clammy and sticky when that occurs. )     Past Medical History:  Diagnosis Date   Abnormal Pap smear of cervix    Cancer ([place])    Chicken pox    Diabetes mellitus without complication ([place])    Ovarian cyst      Family History  Problem Relation Age of Onset   Cancer Mother    Multiple sclerosis Mother    Breast cancer Paternal Aunt        <50   Diabetes Maternal Grandmother    Cancer Paternal Grandmother    COPD Paternal Grandfather    Breast cancer Cousin        first cousin <50     Current Outpatient Medications:    atorvastatin (LIPITOR) 10 MG tablet, Take 1 tab by mouth at bedtime MWF, Disp: 30 tablet, Rfl: 2   Glucagon (GVOKE HYPOPEN 2-PACK) 0.5 MG/0.1ML SOAJ, Inject 1 each into the skin as needed., Disp: 0.2 mL, Rfl: 3   Semaglutide, 1 MG/DOSE, (OZEMPIC, 1 MG/DOSE,) 4 MG/3ML SOPN, Inject 1 mg into the skin once a  week., Disp: 9 mL, Rfl: 1   No Known Allergies   Review of Systems  Constitutional: Negative.   Respiratory: Negative.    Cardiovascular: Negative.   Gastrointestinal: Negative.   Neurological: Negative.      Today's Vitals   04/20/22 1618  BP: 122/68  Pulse: 88  Temp: 98 F (36.7 C)  TempSrc: Oral  Weight: 178 lb (80.7 kg)  Height: _0  (1.651 m)   Body mass index is 29.62 kg/m.  Wt Readings from Last 3 Encounters:  04/20/22 178 lb (80.7 kg)  01/13/22 183 lb 9.6 oz (83.3 kg)  09/23/21 194 lb 9.6 oz (88.3 kg)    Objective:  Physical Exam Vitals reviewed.  Constitutional:      General: She is not in acute distress.    Appearance: Normal appearance.  Cardiovascular:     Rate and Rhythm: Normal rate and regular rhythm.     Pulses: Normal pulses.     Heart sounds: Normal heart sounds. No murmur heard. Pulmonary:     Effort: Pulmonary effort is normal. No respiratory distress.     Breath sounds: Normal breath sounds. No wheezing.  Skin:    Capillary Refill: Capillary refill takes less than 2 seconds.  Neurological:     General: No focal  deficit present.     Mental Status: She is alert and oriented to person, place, and time.     Cranial Nerves: No cranial nerve deficit.     Motor: No weakness.  Psychiatric:        Mood and Affect: Mood normal.        Behavior: Behavior normal.        Thought Content: Thought content normal.        Judgment: Judgment normal.         Assessment And Plan:     1. Type 2 diabetes mellitus with obesity ([place]) Comments: HgbA1c had improved at last visit, continue current medications. Also sent Rx for Gvoke - Glucagon (GVOKE HYPOPEN 2-PACK) 0.5 MG/0.1ML SOAJ; Inject 1 each into the skin as needed.  Dispense: 0.2 mL; Refill: 3 - atorvastatin (LIPITOR) 10 MG tablet; Take 1 tab by mouth at bedtime MWF  Dispense: 30 tablet; Refill: 2 - Hemoglobin A1c  2. Mixed hyperlipidemia Comments: Stable, continue statin. Tolerating well. -  atorvastatin (LIPITOR) 10 MG tablet; Take 1 tab by mouth at bedtime MWF  Dispense: 30 tablet; Refill: 2 - BMP8+eGFR - Lipid panel  3. Class 1 obesity due to excess calories with serious comorbidity and body mass index (BMI) of 30.0 to 30.9 in adult Chronic Discussed healthy diet and regular exercise options  Encouraged to exercise at least 150 minutes per week with 2 days of strength training   Patient was given opportunity to ask questions. Patient verbalized understanding of the plan and was able to repeat key elements of the plan. All questions were answered to their satisfaction.  Minette Brine, FNP   I, Minette Brine, FNP, have reviewed all documentation for this visit. The documentation on 04/20/22 for the exam, diagnosis, procedures, and orders are all accurate and complete.   IF YOU HAVE BEEN REFERRED TO A SPECIALIST, IT MAY TAKE 1-2 WEEKS TO SCHEDULE/PROCESS THE REFERRAL. IF YOU HAVE NOT HEARD FROM US/SPECIALIST IN TWO WEEKS, PLEASE GIVE [place] A CALL AT [phone].   THE PATIENT IS ENCOURAGED TO PRACTICE SOCIAL DISTANCING DUE TO THE COVID-19 PANDEMIC.

## 2022-04-21 LAB — BMP8+EGFR
BUN/Creatinine Ratio: 14 (ref 9–23)
BUN: 10 mg/dL (ref 6–24)
CO2: 24 mmol/L (ref 20–29)
Calcium: 10.2 mg/dL (ref 8.7–10.2)
Chloride: 99 mmol/L (ref 96–106)
Creatinine, Ser: 0.72 mg/dL (ref 0.57–1.00)
Glucose: 85 mg/dL (ref 70–99)
Potassium: 4.5 mmol/L (ref 3.5–5.2)
Sodium: 138 mmol/L (ref 134–144)
eGFR: 106 mL/min/{1.73_m2} (ref >59)

## 2022-04-21 LAB — LIPID PANEL
Chol/HDL Ratio: 4.3 ratio (ref 0.0–4.4)
Cholesterol, Total: 190 mg/dL (ref 100–199)
HDL: 44 mg/dL (ref >39)
LDL Chol Calc (NIH): 118 mg/dL — ABNORMAL HIGH (ref 0–99)
Triglycerides: 158 mg/dL — ABNORMAL HIGH (ref 0–149)
VLDL Cholesterol Cal: 28 mg/dL (ref 5–40)

## 2022-04-21 LAB — HEMOGLOBIN A1C
Est. average glucose Bld gHb Est-mCnc: 117 mg/dL
Hgb A1c MFr Bld: 5.7 % — ABNORMAL HIGH (ref 4.8–5.6)

## 2022-05-18 ENCOUNTER — Ambulatory Visit: Payer: Managed Care, Other (non HMO) | Admitting: Nurse Practitioner

## 2022-07-17 ENCOUNTER — Encounter: Payer: Self-pay | Admitting: Nurse Practitioner

## 2022-07-21 ENCOUNTER — Ambulatory Visit: Payer: Managed Care, Other (non HMO) | Admitting: Nurse Practitioner

## 2022-07-21 ENCOUNTER — Encounter: Payer: Self-pay | Admitting: Nurse Practitioner

## 2022-07-21 VITALS — BP 108/70 | HR 87 | Temp 98.0°F | Ht 65.0 in | Wt 177.0 lb

## 2022-07-21 DIAGNOSIS — E559 Vitamin D deficiency, unspecified: Secondary | ICD-10-CM

## 2022-07-21 DIAGNOSIS — E669 Obesity, unspecified: Secondary | ICD-10-CM | POA: Diagnosis not present

## 2022-07-21 DIAGNOSIS — E1169 Type 2 diabetes mellitus with other specified complication: Secondary | ICD-10-CM

## 2022-07-21 DIAGNOSIS — E782 Mixed hyperlipidemia: Secondary | ICD-10-CM

## 2022-07-21 NOTE — Patient Instructions (Signed)

## 2022-07-21 NOTE — Progress Notes (Signed)
Olivia Wall,acting as a Education administrator for Minette Brine, FNP.,have documented all relevant documentation on the behalf of Minette Brine, FNP,as directed by  Minette Brine, FNP while in the presence of Minette Brine, [place].    Subjective:     Patient ID: Olivia Wall , female    DOB: 09-27-1976 , 46 y.o.   MRN: [id number]   Chief Complaint  Patient presents with   Diabetes    HPI  Patient presents today for a DM check. Continues on Ozempic. She is unable to tolerate metformin.  She notices which foods causes her GI upset  Patient states she is not taking the atorvastatin because she didn't like how it made her feel, she states she tried it for 2 weeks. She woke up one morning and could not get out of bed, made her nauseated. She was not able to go to work. She was feeling lightheaded and sick to her stomach. She drinks about 120 oz water a day.   Wt Readings from Last 3 Encounters: 07/21/22 : 177 lb (80.3 kg) 04/20/22 : 178 lb (80.7 kg) 01/13/22 : 183 lb 9.6 oz (83.3 kg)    Diabetes She presents for her follow-up diabetic visit. She has type 2 diabetes mellitus. There are no hypoglycemic associated symptoms. There are no diabetic associated symptoms. There are no hypoglycemic complications. Current diabetic treatment includes oral agent (monotherapy). (Blood sugar 170 2 hours after meals. Has been ranging 81-101) Eye exam is not current.     Past Medical History:  Diagnosis Date   Abnormal Pap smear of cervix    Cancer ([place])    Chicken pox    Diabetes mellitus without complication ([place])    Ovarian cyst      Family History  Problem Relation Age of Onset   Cancer Mother    Multiple sclerosis Mother    Breast cancer Paternal Aunt        <50   Diabetes Maternal Grandmother    Cancer Paternal Grandmother    COPD Paternal Grandfather    Breast cancer Cousin        first cousin <50     Current Outpatient Medications:    Semaglutide, 1 MG/DOSE, (OZEMPIC, 1 MG/DOSE,) 4 MG/3ML  SOPN, Inject 1 mg into the skin once a week., Disp: 9 mL, Rfl: 1   atorvastatin (LIPITOR) 10 MG tablet, Take 1 tab by mouth at bedtime MWF (Patient not taking: Reported on 07/21/2022), Disp: 30 tablet, Rfl: 2   Glucagon (GVOKE HYPOPEN 2-PACK) 0.5 MG/0.1ML SOAJ, Inject 1 each into the skin as needed. (Patient not taking: Reported on 07/21/2022), Disp: 0.2 mL, Rfl: 3   No Known Allergies   Review of Systems  Constitutional: Negative.   HENT: Negative.    Eyes: Negative.   Respiratory: Negative.    Cardiovascular: Negative.   Gastrointestinal: Negative.      Today's Vitals   07/21/22 0922  BP: 108/70  Pulse: 87  Temp: 98 F (36.7 C)  TempSrc: Oral  Weight: 177 lb (80.3 kg)  Height: '5\' 5"'  (1.651 m)  PainSc: 0-No pain   Body mass index is 29.45 kg/m.  Wt Readings from Last 3 Encounters:  07/21/22 177 lb (80.3 kg)  04/20/22 178 lb (80.7 kg)  01/13/22 183 lb 9.6 oz (83.3 kg)    Objective:  Physical Exam Vitals reviewed.  Constitutional:      General: She is not in acute distress.    Appearance: Normal appearance.  Cardiovascular:  Rate and Rhythm: Normal rate and regular rhythm.     Pulses: Normal pulses.     Heart sounds: Normal heart sounds. No murmur heard. Pulmonary:     Effort: Pulmonary effort is normal. No respiratory distress.     Breath sounds: Normal breath sounds. No wheezing.  Skin:    Capillary Refill: Capillary refill takes less than 2 seconds.  Neurological:     General: No focal deficit present.     Mental Status: She is alert and oriented to person, place, and time.     Cranial Nerves: No cranial nerve deficit.     Motor: No weakness.  Psychiatric:        Mood and Affect: Mood normal.        Behavior: Behavior normal.        Thought Content: Thought content normal.        Judgment: Judgment normal.         Assessment And Plan:     1. Type 2 diabetes mellitus with obesity ([place]) Comments: HgbA1c was improved at last visit, continue Ozempic  tolerating well. - Hemoglobin A1c - BMP8+eGFR - Microalbumin / Creatinine Urine Ratio  2. Mixed hyperlipidemia Comments: She is not taking statin at this time. Will try to find an alternative due to side effects. Discussed importance for heart diseaseWill check cholesterol levels.  3. Vitamin D deficiency Will check vitamin D level and supplement as needed.    Also encouraged to spend 15 minutes in the sun daily.     Patient was given opportunity to ask questions. Patient verbalized understanding of the plan and was able to repeat key elements of the plan. All questions were answered to their satisfaction.  Minette Brine, FNP   I, Minette Brine, FNP, have reviewed all documentation for this visit. The documentation on 07/21/22 for the exam, diagnosis, procedures, and orders are all accurate and complete.   IF YOU HAVE BEEN REFERRED TO A SPECIALIST, IT MAY TAKE 1-2 WEEKS TO SCHEDULE/PROCESS THE REFERRAL. IF YOU HAVE NOT HEARD FROM US/SPECIALIST IN TWO WEEKS, PLEASE GIVE [place] A CALL AT [phone].   THE PATIENT IS ENCOURAGED TO PRACTICE SOCIAL DISTANCING DUE TO THE COVID-19 PANDEMIC.

## 2022-07-22 LAB — MICROALBUMIN / CREATININE URINE RATIO
Creatinine, Urine: 22.5 mg/dL
Microalb/Creat Ratio: 17 mg/g creat (ref 0–29)
Microalbumin, Urine: 3.8 ug/mL

## 2022-07-22 LAB — BMP8+EGFR
BUN/Creatinine Ratio: 12 (ref 9–23)
BUN: 10 mg/dL (ref 6–24)
CO2: 20 mmol/L (ref 20–29)
Calcium: 9.6 mg/dL (ref 8.7–10.2)
Chloride: 102 mmol/L (ref 96–106)
Creatinine, Ser: 0.81 mg/dL (ref 0.57–1.00)
Glucose: 94 mg/dL (ref 70–99)
Potassium: 4.3 mmol/L (ref 3.5–5.2)
Sodium: 140 mmol/L (ref 134–144)
eGFR: 92 mL/min/{1.73_m2} (ref >59)

## 2022-07-22 LAB — HEMOGLOBIN A1C
Est. average glucose Bld gHb Est-mCnc: 117 mg/dL
Hgb A1c MFr Bld: 5.7 % — ABNORMAL HIGH (ref 4.8–5.6)

## 2022-07-24 ENCOUNTER — Encounter: Payer: Self-pay | Admitting: Nurse Practitioner

## 2022-09-08 ENCOUNTER — Encounter (HOSPITAL_BASED_OUTPATIENT_CLINIC_OR_DEPARTMENT_OTHER): Payer: Self-pay

## 2022-09-08 ENCOUNTER — Emergency Department (HOSPITAL_BASED_OUTPATIENT_CLINIC_OR_DEPARTMENT_OTHER)
Admission: EM | Admit: 2022-09-08 | Discharge: 2022-09-09 | Disposition: A | Discharge status: Home / Self Care | Payer: Managed Care, Other (non HMO) | Attending: Emergency Medicine | Admitting: Emergency Medicine

## 2022-09-08 ENCOUNTER — Emergency Department (HOSPITAL_BASED_OUTPATIENT_CLINIC_OR_DEPARTMENT_OTHER): Payer: Managed Care, Other (non HMO) | Admitting: Radiology

## 2022-09-08 ENCOUNTER — Other Ambulatory Visit: Payer: Self-pay

## 2022-09-08 DIAGNOSIS — S61012A Laceration without foreign body of left thumb without damage to nail, initial encounter: Secondary | ICD-10-CM | POA: Insufficient documentation

## 2022-09-08 DIAGNOSIS — W274XXA Contact with kitchen utensil, initial encounter: Secondary | ICD-10-CM | POA: Diagnosis not present

## 2022-09-08 DIAGNOSIS — S6992XA Unspecified injury of left wrist, hand and finger(s), initial encounter: Secondary | ICD-10-CM | POA: Diagnosis present

## 2022-09-08 NOTE — ED Provider Notes (Signed)
[facility] EMERGENCY DEPT Provider Note   CSN: [id number] Arrival date & time: 09/08/22  2127     History  No chief complaint on file.   Olivia Wall is a 46 y.o. female.  HPI Patient is a 46 year old female present emergency room today with laceration at the base of her left thumb.  She states that she was using an apple slicer and it broke while she was using it causing a piece of metal to sliced the base of her thumb.   She has sensation in her finger and no difficulty moving her finger.  She had minimal bleeding.  She is not on any blood thinners.   UTD on tdap    Home Medications Prior to Admission medications   Medication Sig Start Date End Date Taking? Authorizing Provider  atorvastatin (LIPITOR) 10 MG tablet Take 1 tab by mouth at bedtime MWF Patient not taking: Reported on 07/21/2022 04/20/22   Minette Brine, FNP  Glucagon (GVOKE HYPOPEN 2-PACK) 0.5 MG/0.1ML SOAJ Inject 1 each into the skin as needed. Patient not taking: Reported on 07/21/2022 04/20/22   Minette Brine, FNP  Semaglutide, 1 MG/DOSE, (OZEMPIC, 1 MG/DOSE,) 4 MG/3ML SOPN Inject 1 mg into the skin once a week. 02/25/22   Minette Brine, FNP      Allergies    Patient has no known allergies.    Review of Systems   Review of Systems  Physical Exam Updated Vital Signs BP (!) 163/87 (BP Location: Right Arm)   Pulse 90   Temp 97.6 F (36.4 C) (Oral)   Resp 18   LMP 03/05/2013   SpO2 98%  Physical Exam Vitals and nursing note reviewed.  Constitutional:      General: She is not in acute distress.    Appearance: Normal appearance. She is not ill-appearing.  HENT:     Head: Normocephalic and atraumatic.  Eyes:     General: No scleral icterus.       Right eye: No discharge.        Left eye: No discharge.     Conjunctiva/sclera: Conjunctivae normal.  Pulmonary:     Effort: Pulmonary effort is normal.     Breath sounds: No stridor.  Skin:    Comments: Small approx 1cm lac to base of  L thumb  DNVI, cap refill less than 2s  Neurological:     Mental Status: She is alert and oriented to person, place, and time. Mental status is at baseline.     ED Results / Procedures / Treatments   Labs (all labs ordered are listed, but only abnormal results are displayed) Labs Reviewed - No data to display  EKG None  Radiology DG Finger Thumb Left  Result Date: 09/08/2022 CLINICAL DATA:  Thumb injury EXAM: LEFT THUMB 2+V COMPARISON:  None Available. FINDINGS: No fracture or dislocation is seen. The joint spaces are preserved. Soft tissue laceration at the base of the proximal phalanx. No radiopaque foreign body is seen. IMPRESSION: Soft tissue laceration at the base of the proximal phalanx. No fracture, dislocation, or radiopaque foreign body is seen. Electronically Signed   By: Julian Hy M.D.   On: 09/08/2022 22:16    Procedures .Marland KitchenLaceration Repair  Date/Time: 09/09/2022 12:03 AM  Performed by: Tedd Sias, PA Authorized by: Tedd Sias, PA   Consent:    Consent obtained:  Verbal   Consent given by:  Parent and patient   Risks discussed:  Infection, pain, poor cosmetic result and poor  wound healing Universal protocol:    Procedure explained and questions answered to patient or proxy's satisfaction: yes     Relevant documents present and verified: yes     Test results available: yes     Imaging studies available: yes     Required blood products, implants, devices, and special equipment available: yes     Site/side marked: yes     Immediately prior to procedure, a time out was called: yes     Patient identity confirmed:  Verbally with patient and arm band Laceration details:    Location:  Finger   Finger location:  L thumb   Length (cm):  1 Pre-procedure details:    Preparation:  Patient was prepped and draped in usual sterile fashion Exploration:    Limited defect created (wound extended): no     Imaging obtained: x-ray     Imaging outcome:  foreign body not noted     Contaminated: no   Treatment:    Amount of cleaning:  Standard   Irrigation solution:  Tap water   Visualized foreign bodies/material removed: no   Skin repair:    Repair method:  Tissue adhesive Approximation:    Approximation:  Close Repair type:    Repair type:  Simple Post-procedure details:    Dressing:  Open (no dressing)   Procedure completion:  Tolerated     Medications Ordered in ED Medications - No data to display  ED Course/ Medical Decision Making/ A&P                           Medical Decision Making Amount and/or Complexity of Data Reviewed Radiology: ordered.   Patient is a 46 year old female present emergency room today with laceration at the base of her left thumb.  She states that she was using an apple slicer and it broke while she was using it causing a piece of metal to sliced the base of her thumb.   She has sensation in her finger and no difficulty moving her finger.  She had minimal bleeding.  She is not on any blood thinners.   IMPRESSION:  Soft tissue laceration at the base of the proximal phalanx.    No fracture, dislocation, or radiopaque foreign body is seen.      Electronically Signed    By: Julian Hy M.D.    On: 09/08/2022 22:16   Laceration is approx 1 cm nongaping  It is at the base of her thumb in an area that is not particularly under tension.  We do lengthy shared decision-making capitation and she is in agreement with plan to Dermabond.   Final Clinical Impression(s) / ED Diagnoses Final diagnoses:  Laceration of left thumb without foreign body without damage to nail, initial encounter    Rx / DC Orders ED Discharge Orders     None         Tedd Sias, [place] 09/09/22 0008    Cristie Hem, MD 09/09/22 1141

## 2022-09-08 NOTE — ED Provider Notes (Incomplete)
[facility] EMERGENCY DEPT Provider Note   CSN: [id number] Arrival date & time: 09/08/22  2127     History {Add pertinent medical, surgical, social history, OB history to HPI:1} No chief complaint on file.   Olivia Wall is a 46 y.o. female.  HPI        Home Medications Prior to Admission medications   Medication Sig Start Date End Date Taking? Authorizing Provider  atorvastatin (LIPITOR) 10 MG tablet Take 1 tab by mouth at bedtime MWF Patient not taking: Reported on 07/21/2022 04/20/22   Minette Brine, FNP  Glucagon (GVOKE HYPOPEN 2-PACK) 0.5 MG/0.1ML SOAJ Inject 1 each into the skin as needed. Patient not taking: Reported on 07/21/2022 04/20/22   Minette Brine, FNP  Semaglutide, 1 MG/DOSE, (OZEMPIC, 1 MG/DOSE,) 4 MG/3ML SOPN Inject 1 mg into the skin once a week. 02/25/22   Minette Brine, FNP      Allergies    Patient has no known allergies.    Review of Systems   Review of Systems  Physical Exam Updated Vital Signs BP (!) 163/87 (BP Location: Right Arm)   Pulse 90   Temp 97.6 F (36.4 C) (Oral)   Resp 18   LMP 03/05/2013   SpO2 98%  Physical Exam  ED Results / Procedures / Treatments   Labs (all labs ordered are listed, but only abnormal results are displayed) Labs Reviewed - No data to display  EKG None  Radiology DG Finger Thumb Left  Result Date: 09/08/2022 CLINICAL DATA:  Thumb injury EXAM: LEFT THUMB 2+V COMPARISON:  None Available. FINDINGS: No fracture or dislocation is seen. The joint spaces are preserved. Soft tissue laceration at the base of the proximal phalanx. No radiopaque foreign body is seen. IMPRESSION: Soft tissue laceration at the base of the proximal phalanx. No fracture, dislocation, or radiopaque foreign body is seen. Electronically Signed   By: Julian Hy M.D.   On: 09/08/2022 22:16    Procedures .Marland KitchenLaceration Repair  Date/Time: 09/09/2022 12:03 AM  Performed by: Tedd Sias, PA Authorized by:  Tedd Sias, PA   Consent:    Consent obtained:  Verbal   Consent given by:  Parent and patient   Risks discussed:  Infection, pain, poor cosmetic result and poor wound healing Universal protocol:    Procedure explained and questions answered to patient or proxy's satisfaction: yes     Relevant documents present and verified: yes     Test results available: yes     Imaging studies available: yes     Required blood products, implants, devices, and special equipment available: yes     Site/side marked: yes     Immediately prior to procedure, a time out was called: yes     Patient identity confirmed:  Verbally with patient and arm band Exploration:    Imaging outcome: foreign body not noted     Contaminated: no   Treatment:    Amount of cleaning:  Standard   Irrigation solution:  Sterile saline   Visualized foreign bodies/material removed: no   Skin repair:    Repair method:  Sutures Approximation:    Approximation:  Close   {Document cardiac monitor, telemetry assessment procedure when appropriate:1}  Medications Ordered in ED Medications - No data to display  ED Course/ Medical Decision Making/ A&P                           Medical Decision Making Amount and/or  Complexity of Data Reviewed Radiology: ordered.   Patient is a 46 year old female present emergency room today with laceration at the base of her left thumb.  She states that she was using an apple slicer and it broke while she was using it causing a piece of metal to sliced the base of her thumb.   She has sensation in her finger and no difficulty moving her finger.  She had minimal bleeding.  She is not on any blood thinners.   IMPRESSION:  Soft tissue laceration at the base of the proximal phalanx.    No fracture, dislocation, or radiopaque foreign body is seen.      Electronically Signed    By: Julian Hy M.D.    On: 09/08/2022 22:16   Laceration is approx 1 cm nongaping  It is at the base  of her thumb in an area that is not particularly under tension.  We do lengthy shared decision-making capitation and she is in agreement with plan to Dermabond.   Final Clinical Impression(s) / ED Diagnoses Final diagnoses:  Laceration of left thumb without foreign body without damage to nail, initial encounter    Rx / DC Orders ED Discharge Orders     None

## 2022-09-08 NOTE — Discharge Instructions (Signed)
Keep the area clean and dry.  Once the skin glue falls off you can keep the area clean with warm soap and water.  Do not use hydroperoxide.  You can apply a thin layer of Neosporin or Vaseline once the glue has completely fallen off.

## 2022-09-08 NOTE — ED Notes (Signed)
Left thumb lac to pad of thumb, bleeding controlled Pt reports cutting it with apple slicer

## 2022-09-08 NOTE — ED Triage Notes (Signed)
Pt states she was slicing apples w slicer, advised it "broke in her." Bleeing controlled in triage w pressure. Approx 0.5in lac to proximal pad of thumb, bacitracin and dressing applied in triage. Tetanus UTD

## 2022-09-09 NOTE — ED Notes (Signed)
Reviewed AVS/discharge instruction with patient. Time allotted for and all questions answered. Patient is agreeable for d/c and escorted to ed exit by staff.  

## 2022-10-05 ENCOUNTER — Encounter: Payer: Managed Care, Other (non HMO) | Admitting: Nurse Practitioner

## 2022-10-07 ENCOUNTER — Other Ambulatory Visit: Payer: Self-pay | Admitting: Nurse Practitioner

## 2022-10-07 DIAGNOSIS — Z1231 Encounter for screening mammogram for malignant neoplasm of breast: Secondary | ICD-10-CM

## 2022-10-13 ENCOUNTER — Other Ambulatory Visit: Payer: Self-pay

## 2022-10-13 DIAGNOSIS — E1169 Type 2 diabetes mellitus with other specified complication: Secondary | ICD-10-CM

## 2022-10-13 MED ORDER — OZEMPIC (1 MG/DOSE) 4 MG/3ML ~~LOC~~ SOPN: 1.0000 mg | PEN_INJECTOR | SUBCUTANEOUS | 1 refills | Status: DC

## 2022-10-31 LAB — HM DIABETES EYE EXAM

## 2022-12-01 ENCOUNTER — Ambulatory Visit
Admission: RE | Admit: 2022-12-01 | Discharge: 2022-12-01 | Disposition: A | Discharge status: Home / Self Care | Payer: Managed Care, Other (non HMO) | Source: Ambulatory Visit | Attending: Nurse Practitioner | Admitting: Nurse Practitioner

## 2022-12-01 DIAGNOSIS — Z1231 Encounter for screening mammogram for malignant neoplasm of breast: Secondary | ICD-10-CM

## 2022-12-24 ENCOUNTER — Encounter: Payer: Self-pay | Admitting: Nurse Practitioner

## 2022-12-24 ENCOUNTER — Ambulatory Visit: Payer: Managed Care, Other (non HMO) | Admitting: Nurse Practitioner

## 2022-12-24 ENCOUNTER — Other Ambulatory Visit (HOSPITAL_COMMUNITY)
Admission: RE | Admit: 2022-12-24 | Discharge: 2022-12-24 | Disposition: A | Discharge status: Home / Self Care | Payer: Managed Care, Other (non HMO) | Source: Ambulatory Visit | Attending: Nurse Practitioner | Admitting: Nurse Practitioner

## 2022-12-24 VITALS — BP 122/84 | HR 86 | Temp 98.2°F | Ht 65.0 in | Wt 178.8 lb

## 2022-12-24 DIAGNOSIS — E1169 Type 2 diabetes mellitus with other specified complication: Secondary | ICD-10-CM

## 2022-12-24 DIAGNOSIS — Z87410 Personal history of cervical dysplasia: Secondary | ICD-10-CM | POA: Insufficient documentation

## 2022-12-24 DIAGNOSIS — E669 Obesity, unspecified: Secondary | ICD-10-CM

## 2022-12-24 DIAGNOSIS — Z79899 Other long term (current) drug therapy: Secondary | ICD-10-CM

## 2022-12-24 DIAGNOSIS — E559 Vitamin D deficiency, unspecified: Secondary | ICD-10-CM

## 2022-12-24 DIAGNOSIS — Z0000 Encounter for general adult medical examination without abnormal findings: Secondary | ICD-10-CM | POA: Diagnosis not present

## 2022-12-24 DIAGNOSIS — Z1211 Encounter for screening for malignant neoplasm of colon: Secondary | ICD-10-CM

## 2022-12-24 DIAGNOSIS — E782 Mixed hyperlipidemia: Secondary | ICD-10-CM | POA: Diagnosis not present

## 2022-12-24 NOTE — Patient Instructions (Signed)

## 2022-12-24 NOTE — Progress Notes (Signed)
Barnet [place] Martin,acting as a Education administrator for Minette Brine, FNP.,have documented all relevant documentation on the behalf of Minette Brine, FNP,as directed by  Minette Brine, FNP while in the presence of Minette Brine, [place].   Subjective:     Patient ID: Olivia Wall , female    DOB: May 06, 1976 , 47 y.o.   MRN: [id number]   Chief Complaint  Patient presents with   Annual Exam    HPI  Patient presents today for HM, patient states compliance with medications and has no other concerns.   BP Readings from Last 3 Encounters: 12/24/22 : 122/84 09/08/22 : (!) 163/87 07/21/22 : 108/70  She is using a Dexcom G7  Wt Readings from Last 3 Encounters: 12/24/22 : 178 lb 12.8 oz (81.1 kg) 07/21/22 : 177 lb (80.3 kg) 04/20/22 : 178 lb (80.7 kg)     Diabetes She presents for her follow-up diabetic visit. She has type 2 diabetes mellitus. There are no hypoglycemic associated symptoms. There are no diabetic associated symptoms. There are no diabetic complications. Risk factors for coronary artery disease include diabetes mellitus and obesity. She is following a generally healthy diet. When asked about meal planning, she reported none. She has not had a previous visit with a dietitian. (Average glucose 131 and GMI % is 6.4. she is using a Dexcom she ordered online. ) She does not see a podiatrist.Eye exam is current.     Past Medical History:  Diagnosis Date   Abnormal Pap smear of cervix    Cancer ([place])    Chicken pox    Diabetes mellitus without complication ([place])    Ovarian cyst      Family History  Problem Relation Age of Onset   Cancer Mother    Multiple sclerosis Mother    Breast cancer Paternal Aunt        <50   Diabetes Maternal Grandmother    Cancer Paternal Grandmother    COPD Paternal Grandfather    Breast cancer Cousin        first cousin <50     Current Outpatient Medications:    rosuvastatin (CRESTOR) 5 MG tablet, Take 1 tablet (5 mg total) by mouth daily., Disp: 30  tablet, Rfl: 2   Semaglutide, 1 MG/DOSE, (OZEMPIC, 1 MG/DOSE,) 4 MG/3ML SOPN, Inject 1 mg into the skin once a week., Disp: 9 mL, Rfl: 1   Vitamin D, Ergocalciferol, (DRISDOL) 1.25 MG (50000 UNIT) CAPS capsule, Take 1 capsule (50,000 Units total) by mouth 2 (two) times a week., Disp: 24 capsule, Rfl: 1   No Known Allergies    The patient states she uses status post hysterectomy. Continues to get PAP every 3-5 years due to history of abnormal PAPs.  Patient's last menstrual period was 03/05/2013.. . Negative for: breast discharge, breast lump(s), breast pain and breast self exam. Associated symptoms include abnormal vaginal bleeding. Pertinent negatives include abnormal bleeding (hematology), anxiety, decreased libido, depression, difficulty falling sleep, dyspareunia, history of infertility, nocturia, sexual dysfunction, sleep disturbances, urinary incontinence, urinary urgency, vaginal discharge and vaginal itching. Diet regular; she has tried to cut back on bread, will eat vegetables, shrimp, salmon and chicken. She drinks at least 80 oz of water a day. The patient states her exercise level is minimal - she has an exercise ball and planks during the day and will walk on the treadmill.   The patient's tobacco use is:  Social History   Tobacco Use  Smoking Status Former   Packs/day: 0.50  Years: 15.00   Total pack years: 7.50   Types: Cigarettes   Quit date: 03/05/2012   Years since quitting: 10.8  Smokeless Tobacco Never   She has been exposed to passive smoke. The patient's alcohol use is:  Social History   Substance and Sexual Activity  Alcohol Use No  . Additional information: Last pap 2020, next one scheduled for today.    Review of Systems  Constitutional: Negative.   HENT: Negative.    Eyes: Negative.   Respiratory: Negative.    Cardiovascular: Negative.   Gastrointestinal: Negative.   Endocrine: Negative.   Genitourinary: Negative.   Musculoskeletal: Negative.   Skin:  Negative.   Allergic/Immunologic: Negative.   Neurological: Negative.   Hematological: Negative.   Psychiatric/Behavioral: Negative.       Today's Vitals   12/24/22 1411  BP: 122/84  Pulse: 86  Temp: 98.2 F (36.8 C)  TempSrc: Oral  Weight: 178 lb 12.8 oz (81.1 kg)  Height: '5\' 5"'$  (1.651 m)  PainSc: 0-No pain   Body mass index is 29.75 kg/m.  Wt Readings from Last 3 Encounters:  12/24/22 178 lb 12.8 oz (81.1 kg)  07/21/22 177 lb (80.3 kg)  04/20/22 178 lb (80.7 kg)    Objective:  Physical Exam Vitals reviewed.  Constitutional:      General: She is not in acute distress.    Appearance: Normal appearance. She is well-developed. She is obese.  HENT:     Head: Normocephalic and atraumatic.     Right Ear: Hearing, tympanic membrane, ear canal and external ear normal. There is no impacted cerumen.     Left Ear: Hearing, tympanic membrane, ear canal and external ear normal. There is no impacted cerumen.     Nose:     Comments: Deferred - masked    Mouth/Throat:     Comments: Deferred - masked Eyes:     General: Lids are normal.     Extraocular Movements: Extraocular movements intact.     Conjunctiva/sclera: Conjunctivae normal.     Pupils: Pupils are equal, round, and reactive to light.     Funduscopic exam:    Right eye: No papilledema.        Left eye: No papilledema.  Neck:     Thyroid: No thyroid mass.     Vascular: No carotid bruit.  Cardiovascular:     Rate and Rhythm: Normal rate and regular rhythm.     Pulses: Normal pulses.     Heart sounds: Normal heart sounds. No murmur heard. Pulmonary:     Effort: Pulmonary effort is normal.     Breath sounds: Normal breath sounds.  Chest:     Chest wall: No mass.  Breasts:    Tanner Score is 5.     Right: Normal. No mass or tenderness.     Left: Normal. No mass or tenderness.  Abdominal:     General: Abdomen is flat. Bowel sounds are normal. There is no distension.     Palpations: Abdomen is soft.      Tenderness: There is no abdominal tenderness.  Genitourinary:    Rectum: Guaiac result negative.  Musculoskeletal:        General: No swelling. Normal range of motion.     Cervical back: Full passive range of motion without pain, normal range of motion and neck supple.     Right lower leg: No edema.     Left lower leg: No edema.  Lymphadenopathy:     Upper Body:  Right upper body: No supraclavicular, axillary or pectoral adenopathy.     Left upper body: No supraclavicular, axillary or pectoral adenopathy.  Skin:    General: Skin is warm and dry.     Capillary Refill: Capillary refill takes less than 2 seconds.  Neurological:     General: No focal deficit present.     Mental Status: She is alert and oriented to person, place, and time.     Cranial Nerves: No cranial nerve deficit.     Sensory: No sensory deficit.  Psychiatric:        Mood and Affect: Mood normal.        Behavior: Behavior normal.        Thought Content: Thought content normal.        Judgment: Judgment normal.         Assessment And Plan:     1. Annual physical exam  2. Type 2 diabetes mellitus with obesity ([place]) Comments: HgbA1c stable continue current medications. Encouraged to focus on healthy diet and regular exercise. - EKG 12-Lead - Microalbumin / creatinine urine ratio - POCT urinalysis dipstick - Hemoglobin A1c  3. Mixed hyperlipidemia Comments: Cholesterol levels have been elevated, will check lipid panel today. Encouraged to eat a low fat diet. - CMP14+EGFR - Lipid panel  4. Vitamin D deficiency Will check vitamin D level and supplement as needed.    Also encouraged to spend 15 minutes in the sun daily.  - VITAMIN D 25 Hydroxy (Vit-D Deficiency, Fractures)  5. History of cervical dysplasia - Cytology -Pap Smear  6. Encounter for screening colonoscopy According to USPTF Colorectal cancer Screening guidelines. Colonoscopy is recommended every 10 years, starting at age 29years. Will  refer to GI for colon cancer screening. - Ambulatory referral to Gastroenterology  7. Other long term (current) drug therapy - CBC   Patient was given opportunity to ask questions. Patient verbalized understanding of the plan and was able to repeat key elements of the plan. All questions were answered to their satisfaction.   Minette Brine, FNP   I, Minette Brine, FNP, have reviewed all documentation for this visit. The documentation on 12/24/22 for the exam, diagnosis, procedures, and orders are all accurate and complete.   THE PATIENT IS ENCOURAGED TO PRACTICE SOCIAL DISTANCING DUE TO THE COVID-19 PANDEMIC.

## 2022-12-26 LAB — CMP14+EGFR
ALT: 15 IU/L (ref 0–32)
AST: 14 IU/L (ref 0–40)
Albumin/Globulin Ratio: 1.5 (ref 1.2–2.2)
Albumin: 4.7 g/dL (ref 3.9–4.9)
Alkaline Phosphatase: 86 IU/L (ref 44–121)
BUN/Creatinine Ratio: 19 (ref 9–23)
BUN: 14 mg/dL (ref 6–24)
Bilirubin Total: 0.2 mg/dL (ref 0.0–1.2)
CO2: 24 mmol/L (ref 20–29)
Calcium: 9.9 mg/dL (ref 8.7–10.2)
Chloride: 99 mmol/L (ref 96–106)
Creatinine, Ser: 0.73 mg/dL (ref 0.57–1.00)
Globulin, Total: 3.1 g/dL (ref 1.5–4.5)
Glucose: 100 mg/dL — ABNORMAL HIGH (ref 70–99)
Potassium: 4.5 mmol/L (ref 3.5–5.2)
Sodium: 137 mmol/L (ref 134–144)
Total Protein: 7.8 g/dL (ref 6.0–8.5)
eGFR: 103 mL/min/{1.73_m2} (ref >59)

## 2022-12-26 LAB — CBC
Hematocrit: 39 % (ref 34.0–46.6)
Hemoglobin: 13.4 g/dL (ref 11.1–15.9)
MCH: 34.4 pg — ABNORMAL HIGH (ref 26.6–33.0)
MCHC: 34.4 g/dL (ref 31.5–35.7)
MCV: 100 fL — ABNORMAL HIGH (ref 79–97)
Platelets: 358 10*3/uL (ref 150–450)
RBC: 3.89 x10E6/uL (ref 3.77–5.28)
RDW: 11 % — ABNORMAL LOW (ref 11.7–15.4)
WBC: 9.2 10*3/uL (ref 3.4–10.8)

## 2022-12-26 LAB — MICROALBUMIN / CREATININE URINE RATIO
Creatinine, Urine: 98.3 mg/dL
Microalb/Creat Ratio: 28 mg/g creat (ref 0–29)
Microalbumin, Urine: 27.1 ug/mL

## 2022-12-26 LAB — LIPID PANEL
Chol/HDL Ratio: 4.3 ratio (ref 0.0–4.4)
Cholesterol, Total: 221 mg/dL — ABNORMAL HIGH (ref 100–199)
HDL: 51 mg/dL (ref >39)
LDL Chol Calc (NIH): 135 mg/dL — ABNORMAL HIGH (ref 0–99)
Triglycerides: 197 mg/dL — ABNORMAL HIGH (ref 0–149)
VLDL Cholesterol Cal: 35 mg/dL (ref 5–40)

## 2022-12-26 LAB — HEMOGLOBIN A1C
Est. average glucose Bld gHb Est-mCnc: 117 mg/dL
Hgb A1c MFr Bld: 5.7 % — ABNORMAL HIGH (ref 4.8–5.6)

## 2022-12-26 LAB — VITAMIN D 25 HYDROXY (VIT D DEFICIENCY, FRACTURES): Vit D, 25-Hydroxy: 13.9 ng/mL — ABNORMAL LOW (ref 30.0–100.0)

## 2022-12-28 ENCOUNTER — Other Ambulatory Visit: Payer: Self-pay | Admitting: Nurse Practitioner

## 2022-12-28 ENCOUNTER — Encounter: Payer: Self-pay | Admitting: Nurse Practitioner

## 2022-12-28 DIAGNOSIS — E782 Mixed hyperlipidemia: Secondary | ICD-10-CM

## 2022-12-28 DIAGNOSIS — E1169 Type 2 diabetes mellitus with other specified complication: Secondary | ICD-10-CM

## 2022-12-28 DIAGNOSIS — E559 Vitamin D deficiency, unspecified: Secondary | ICD-10-CM

## 2022-12-28 MED ORDER — VITAMIN D (ERGOCALCIFEROL) 1.25 MG (50000 UNIT) PO CAPS: 50000.0000 [IU] | ORAL_CAPSULE | ORAL | 1 refills | Status: DC

## 2022-12-28 MED ORDER — OZEMPIC (1 MG/DOSE) 4 MG/3ML ~~LOC~~ SOPN: 1.0000 mg | PEN_INJECTOR | SUBCUTANEOUS | 1 refills | Status: DC

## 2022-12-28 MED ORDER — ROSUVASTATIN CALCIUM 5 MG PO TABS: 5.0000 mg | ORAL_TABLET | Freq: Every day | ORAL | 2 refills | Status: DC

## 2022-12-28 MED ORDER — ATORVASTATIN CALCIUM 20 MG PO TABS: 20.0000 mg | ORAL_TABLET | Freq: Every day | ORAL | 11 refills | Status: DC

## 2023-01-04 DIAGNOSIS — E1169 Type 2 diabetes mellitus with other specified complication: Secondary | ICD-10-CM | POA: Insufficient documentation

## 2023-01-05 ENCOUNTER — Telehealth: Payer: Self-pay

## 2023-01-05 LAB — CYTOLOGY - PAP
Comment: NEGATIVE
Diagnosis: NEGATIVE
High risk HPV: NEGATIVE

## 2023-01-05 NOTE — Telephone Encounter (Signed)
This medication or product was previously approved on [id number] from 2022-10-12 to 2023-10-13. You will be able to fill a prescription for this medication at your pharmacy. If your pharmacy has questions regarding the processing of your prescription, please have them call the [facility] pharmacy help desk at [phone].

## 2023-01-05 NOTE — Telephone Encounter (Signed)
Per pharmacy Ozempic requires PA.  PA started via CMM Key: [id number]

## 2023-01-30 ENCOUNTER — Other Ambulatory Visit: Payer: Self-pay | Admitting: Nurse Practitioner

## 2023-01-30 DIAGNOSIS — E559 Vitamin D deficiency, unspecified: Secondary | ICD-10-CM

## 2023-03-26 ENCOUNTER — Other Ambulatory Visit: Payer: Self-pay | Admitting: Nurse Practitioner

## 2023-03-26 DIAGNOSIS — E1169 Type 2 diabetes mellitus with other specified complication: Secondary | ICD-10-CM

## 2023-03-26 DIAGNOSIS — E782 Mixed hyperlipidemia: Secondary | ICD-10-CM

## 2023-04-26 ENCOUNTER — Encounter: Payer: Self-pay | Admitting: Nurse Practitioner

## 2023-04-26 ENCOUNTER — Ambulatory Visit (INDEPENDENT_AMBULATORY_CARE_PROVIDER_SITE_OTHER): Payer: Managed Care, Other (non HMO) | Admitting: Nurse Practitioner

## 2023-04-26 VITALS — BP 110/68 | HR 78 | Temp 98.1°F | Ht 65.0 in | Wt 178.8 lb

## 2023-04-26 DIAGNOSIS — E785 Hyperlipidemia, unspecified: Secondary | ICD-10-CM | POA: Diagnosis not present

## 2023-04-26 DIAGNOSIS — E1169 Type 2 diabetes mellitus with other specified complication: Secondary | ICD-10-CM

## 2023-04-26 DIAGNOSIS — E559 Vitamin D deficiency, unspecified: Secondary | ICD-10-CM

## 2023-04-26 MED ORDER — VITAMIN D (ERGOCALCIFEROL) 1.25 MG (50000 UNIT) PO CAPS: 50000.0000 [IU] | ORAL_CAPSULE | ORAL | 1 refills | Status: DC

## 2023-04-26 MED ORDER — OZEMPIC (1 MG/DOSE) 4 MG/3ML ~~LOC~~ SOPN: 1.0000 mg | PEN_INJECTOR | SUBCUTANEOUS | 1 refills | Status: DC

## 2023-04-26 NOTE — Assessment & Plan Note (Signed)
Will refill vitamin d and recheck levels. Continue getting at least 15 minutes of sunlight daily

## 2023-04-26 NOTE — Assessment & Plan Note (Signed)
HgbA1c was stable last visit, may be increased this visit due to increased stress. Continue current medications

## 2023-04-26 NOTE — Progress Notes (Signed)
Madelaine Bhat, CMA,acting as a Neurosurgeon for Arnette Felts, FNP.,have documented all relevant documentation on the behalf of Arnette Felts, FNP,as directed by  Arnette Felts, FNP while in the presence of Arnette Felts, FNP.  Subjective:  Patient ID: Olivia Wall , female    DOB: 07-14-1976 , 47 y.o.   MRN: [id number]  Chief Complaint  Patient presents with   Diabetes    HPI  Patient presents today for a DM and Chol check. She has been under increased stress due to her father being in the hospital for 40 days. Patient reports compliance with medications and has no other concerns today. Patient denies any chest pain, SOB, and headaches.   BP Readings from Last 3 Encounters: 04/26/23 : 110/68 12/24/22 : 122/84 09/08/22 : Marland Kitchen 163/87   Wt Readings from Last 3 Encounters: 04/26/23 : 178 lb 12.8 oz (81.1 kg) 12/24/22 : 178 lb 12.8 oz (81.1 kg) 07/21/22 : 177 lb (80.3 kg)       Past Medical History:  Diagnosis Date   Abnormal Pap smear of cervix    Cancer (HCC)    Chicken pox    Diabetes mellitus without complication (HCC)    Ovarian cyst      Family History  Problem Relation Age of Onset   Cancer Mother    Multiple sclerosis Mother    Breast cancer Paternal Aunt        <50   Diabetes Maternal Grandmother    Cancer Paternal Grandmother    COPD Paternal Grandfather    Breast cancer Cousin        first cousin <50     Current Outpatient Medications:    rosuvastatin (CRESTOR) 5 MG tablet, TAKE 1 TABLET(5 MG) BY MOUTH DAILY, Disp: 90 tablet, Rfl: 1   Semaglutide, 1 MG/DOSE, (OZEMPIC, 1 MG/DOSE,) 4 MG/3ML SOPN, Inject 1 mg into the skin once a week., Disp: 9 mL, Rfl: 1   Vitamin D, Ergocalciferol, (DRISDOL) 1.25 MG (50000 UNIT) CAPS capsule, Take 1 capsule (50,000 Units total) by mouth 2 (two) times a week., Disp: 24 capsule, Rfl: 1   No Known Allergies   Review of Systems  Constitutional: Negative.   HENT: Negative.    Eyes: Negative.   Respiratory: Negative.     Cardiovascular: Negative.   Gastrointestinal: Negative.   Musculoskeletal: Negative.   Skin: Negative.   Neurological: Negative.   Psychiatric/Behavioral: Negative.       Today's Vitals   04/26/23 0845  BP: 110/68  Pulse: 78  Temp: 98.1 F (36.7 C)  TempSrc: Oral  Weight: 178 lb 12.8 oz (81.1 kg)  Height: 5\' 5"  (1.651 m)  PainSc: 0-No pain   Body mass index is 29.75 kg/m.  Wt Readings from Last 3 Encounters:  04/26/23 178 lb 12.8 oz (81.1 kg)  12/24/22 178 lb 12.8 oz (81.1 kg)  07/21/22 177 lb (80.3 kg)    The 10-year ASCVD risk score (Arnett DK, et al., 2019) is: 1.6%   Values used to calculate the score:     Age: 35 years     Sex: Female     Is Non-Hispanic African American: No     Diabetic: Yes     Tobacco smoker: No     Systolic Blood Pressure: 110 mmHg     Is BP treated: No     HDL Cholesterol: 51 mg/dL     Total Cholesterol: 221 mg/dL  Objective:  Physical Exam Vitals reviewed.  Constitutional:  General: She is not in acute distress.    Appearance: Normal appearance.  Cardiovascular:     Rate and Rhythm: Normal rate and regular rhythm.     Pulses: Normal pulses.     Heart sounds: Normal heart sounds. No murmur heard. Pulmonary:     Effort: Pulmonary effort is normal. No respiratory distress.     Breath sounds: Normal breath sounds. No wheezing.  Skin:    Capillary Refill: Capillary refill takes less than 2 seconds.  Neurological:     General: No focal deficit present.     Mental Status: She is alert and oriented to person, place, and time.     Cranial Nerves: No cranial nerve deficit.     Motor: No weakness.  Psychiatric:        Mood and Affect: Mood normal.        Behavior: Behavior normal.        Thought Content: Thought content normal.        Judgment: Judgment normal.     Diabetic Foot Exam - Simple   Simple Foot Form Diabetic Foot exam was performed with the following findings: Yes 04/26/2023  9:30 AM  Visual Inspection Sensation  Testing Pulse Check Comments     Assessment And Plan:  Type 2 diabetes mellitus with hyperlipidemia (HCC) Assessment & Plan: HgbA1c was stable last visit, may be increased this visit due to increased stress. Continue current medications  Orders: -     Hemoglobin A1c -     Lipid panel -     Ozempic (1 MG/DOSE); Inject 1 mg into the skin once a week.  Dispense: 9 mL; Refill: 1  Vitamin D deficiency Assessment & Plan: Will refill vitamin d and recheck levels. Continue getting at least 15 minutes of sunlight daily  Orders: -     VITAMIN D 25 Hydroxy (Vit-D Deficiency, Fractures) -     Vitamin D (Ergocalciferol); Take 1 capsule (50,000 Units total) by mouth 2 (two) times a week.  Dispense: 24 capsule; Refill: 1   Return for 4 month f/u diabetes.   Patient was given opportunity to ask questions. Patient verbalized understanding of the plan and was able to repeat key elements of the plan. All questions were answered to their satisfaction.    Jeanell Sparrow, FNP, have reviewed all documentation for this visit. The documentation on 04/26/23 for the exam, diagnosis, procedures, and orders are all accurate and complete.   IF YOU HAVE BEEN REFERRED TO A SPECIALIST, IT MAY TAKE 1-2 WEEKS TO SCHEDULE/PROCESS THE REFERRAL. IF YOU HAVE NOT HEARD FROM US/SPECIALIST IN TWO WEEKS, PLEASE GIVE [place] A CALL AT [phone].

## 2023-04-27 LAB — LIPID PANEL
Chol/HDL Ratio: 3.4 ratio (ref 0.0–4.4)
Cholesterol, Total: 155 mg/dL (ref 100–199)
HDL: 46 mg/dL (ref >39)
LDL Chol Calc (NIH): 92 mg/dL (ref 0–99)
Triglycerides: 92 mg/dL (ref 0–149)
VLDL Cholesterol Cal: 17 mg/dL (ref 5–40)

## 2023-04-27 LAB — VITAMIN D 25 HYDROXY (VIT D DEFICIENCY, FRACTURES): Vit D, 25-Hydroxy: 27.9 ng/mL — ABNORMAL LOW (ref 30.0–100.0)

## 2023-04-27 LAB — HEMOGLOBIN A1C
Est. average glucose Bld gHb Est-mCnc: 120 mg/dL
Hgb A1c MFr Bld: 5.8 % — ABNORMAL HIGH (ref 4.8–5.6)

## 2023-07-02 LAB — HM COLONOSCOPY

## 2023-07-27 ENCOUNTER — Other Ambulatory Visit: Payer: Self-pay | Admitting: Nurse Practitioner

## 2023-07-27 DIAGNOSIS — E559 Vitamin D deficiency, unspecified: Secondary | ICD-10-CM

## 2023-08-30 ENCOUNTER — Ambulatory Visit (INDEPENDENT_AMBULATORY_CARE_PROVIDER_SITE_OTHER): Payer: Managed Care, Other (non HMO) | Admitting: Nurse Practitioner

## 2023-08-30 ENCOUNTER — Encounter: Payer: Self-pay | Admitting: Nurse Practitioner

## 2023-08-30 VITALS — BP 122/70 | HR 76 | Temp 98.1°F | Ht 65.0 in | Wt 191.8 lb

## 2023-08-30 DIAGNOSIS — E785 Hyperlipidemia, unspecified: Secondary | ICD-10-CM

## 2023-08-30 DIAGNOSIS — R635 Abnormal weight gain: Secondary | ICD-10-CM | POA: Insufficient documentation

## 2023-08-30 DIAGNOSIS — E1169 Type 2 diabetes mellitus with other specified complication: Secondary | ICD-10-CM

## 2023-08-30 MED ORDER — OZEMPIC (2 MG/DOSE) 8 MG/3ML ~~LOC~~ SOPN
2.0000 mg | PEN_INJECTOR | SUBCUTANEOUS | 1 refills | Status: DC
Start: 2023-08-30 — End: 2023-10-22

## 2023-08-30 NOTE — Progress Notes (Signed)
Madelaine Bhat, CMA,acting as a Neurosurgeon for Arnette Felts, FNP.,have documented all relevant documentation on the behalf of Arnette Felts, FNP,as directed by  Arnette Felts, FNP while in the presence of Arnette Felts, FNP.  Subjective:  Patient ID: Olivia Wall , female    DOB: [birth year] , 47 y.o.   MRN: [id number]  Chief Complaint  Patient presents with   Diabetes    HPI  Patient presents today for a chol and dm follow up, Patient reports compliance with medication. Patient denies any chest pain, SOB, or headaches. Patient has no concerns today. She continues with Ozempic. She has not changed anything with her diet. She feels since her colonoscopy her weight changes. She is now having "pins and needles" feeling in her right leg. She continues to exercising with walking. She is not sleeping as well as before. She is uncomfortable at night due to sleeping on her right shoulder - she has 5 pins in her shoulder, since 2014. She will put tiger balm and tylenol.   BP Readings from Last 3 Encounters: 08/30/23 : 122/70 04/26/23 : 110/68 12/24/22 : 122/84       Past Medical History:  Diagnosis Date   Abnormal Pap smear of cervix    Cancer (HCC)    Chicken pox    Diabetes mellitus without complication (HCC)    Ovarian cyst      Family History  Problem Relation Age of Onset   Cancer Mother    Multiple sclerosis Mother    Breast cancer Paternal Aunt        <50   Diabetes Maternal Grandmother    Cancer Paternal Grandmother    COPD Paternal Grandfather    Breast cancer Cousin        first cousin <50     Current Outpatient Medications:    rosuvastatin (CRESTOR) 5 MG tablet, TAKE 1 TABLET(5 MG) BY MOUTH DAILY, Disp: 90 tablet, Rfl: 1   Semaglutide, 2 MG/DOSE, (OZEMPIC, 2 MG/DOSE,) 8 MG/3ML SOPN, Inject 2 mg into the skin once a week., Disp: 6 mL, Rfl: 1   Vitamin D, Ergocalciferol, (DRISDOL) 1.25 MG (50000 UNIT) CAPS capsule, TAKE 1 CAPSULE BY MOUTH TWICE  WEEKLY, Disp: 26 capsule,  Rfl: 3   No Known Allergies   Review of Systems  Constitutional: Negative.   HENT: Negative.    Eyes: Negative.   Respiratory: Negative.    Cardiovascular: Negative.   Gastrointestinal: Negative.   Musculoskeletal:        Right shoulder surgery - having pain.   Neurological: Negative.   Psychiatric/Behavioral: Negative.       Today's Vitals   08/30/23 0833  BP: 122/70  Pulse: 76  Temp: 98.1 F (36.7 C)  TempSrc: Oral  Weight: 191 lb 12.8 oz (87 kg)  Height: 5\' 5"  (1.651 m)  PainSc: 3   PainLoc: Back   Body mass index is 31.92 kg/m.  Wt Readings from Last 3 Encounters:  08/30/23 191 lb 12.8 oz (87 kg)  04/26/23 178 lb 12.8 oz (81.1 kg)  12/24/22 178 lb 12.8 oz (81.1 kg)     Objective:  Physical Exam Vitals reviewed.  Constitutional:      General: She is not in acute distress.    Appearance: Normal appearance. She is obese.  Cardiovascular:     Rate and Rhythm: Normal rate and regular rhythm.     Pulses: Normal pulses.     Heart sounds: Normal heart sounds. No murmur heard. Pulmonary:  Effort: Pulmonary effort is normal. No respiratory distress.     Breath sounds: Normal breath sounds. No wheezing.  Skin:    General: Skin is warm and dry.     Capillary Refill: Capillary refill takes less than 2 seconds.  Neurological:     General: No focal deficit present.     Mental Status: She is alert and oriented to person, place, and time.     Cranial Nerves: No cranial nerve deficit.     Motor: No weakness.  Psychiatric:        Mood and Affect: Mood normal.        Behavior: Behavior normal.        Thought Content: Thought content normal.        Judgment: Judgment normal.         Assessment And Plan:  Type 2 diabetes mellitus with hyperlipidemia (HCC) Assessment & Plan: HgbA1c is stable, I will increase her Ozempic due to recent weight gain.   Orders: -     Lipid panel -     Hemoglobin A1c -     Ozempic (2 MG/DOSE); Inject 2 mg into the skin once a  week.  Dispense: 6 mL; Refill: 1 -     BMP8+eGFR  Abnormal weight gain Assessment & Plan: She has had a significant weight gain since her last visit, unknown why. Not changed any of her eating habits or exercise habits, will check for metabolic cause and make sure not related to fluid overload. No obvious leg swelling.   Orders: -     TSH -     Brain natriuretic peptide    Return for controlled DM check 4 months.  Patient was given opportunity to ask questions. Patient verbalized understanding of the plan and was able to repeat key elements of the plan. All questions were answered to their satisfaction.    Jeanell Sparrow, FNP, have reviewed all documentation for this visit. The documentation on 08/30/23 for the exam, diagnosis, procedures, and orders are all accurate and complete.   IF YOU HAVE BEEN REFERRED TO A SPECIALIST, IT MAY TAKE 1-2 WEEKS TO SCHEDULE/PROCESS THE REFERRAL. IF YOU HAVE NOT HEARD FROM US/SPECIALIST IN TWO WEEKS, PLEASE GIVE [place] A CALL AT [phone].

## 2023-08-30 NOTE — Assessment & Plan Note (Signed)
HgbA1c is stable, I will increase her Ozempic due to recent weight gain.

## 2023-08-30 NOTE — Assessment & Plan Note (Signed)
She has had a significant weight gain since her last visit, unknown why. Not changed any of her eating habits or exercise habits, will check for metabolic cause and make sure not related to fluid overload. No obvious leg swelling.

## 2023-08-31 LAB — BMP8+EGFR
BUN/Creatinine Ratio: 13 (ref 9–23)
BUN: 9 mg/dL (ref 6–24)
CO2: 21 mmol/L (ref 20–29)
Calcium: 9.7 mg/dL (ref 8.7–10.2)
Chloride: 103 mmol/L (ref 96–106)
Creatinine, Ser: 0.72 mg/dL (ref 0.57–1.00)
Glucose: 122 mg/dL — ABNORMAL HIGH (ref 70–99)
Potassium: 5.2 mmol/L (ref 3.5–5.2)
Sodium: 139 mmol/L (ref 134–144)
eGFR: 105 mL/min/{1.73_m2} (ref >59)

## 2023-08-31 LAB — LIPID PANEL
Chol/HDL Ratio: 4.5 ratio — ABNORMAL HIGH (ref 0.0–4.4)
Cholesterol, Total: 202 mg/dL — ABNORMAL HIGH (ref 100–199)
HDL: 45 mg/dL (ref >39)
LDL Chol Calc (NIH): 128 mg/dL — ABNORMAL HIGH (ref 0–99)
Triglycerides: 160 mg/dL — ABNORMAL HIGH (ref 0–149)
VLDL Cholesterol Cal: 29 mg/dL (ref 5–40)

## 2023-08-31 LAB — HEMOGLOBIN A1C
Est. average glucose Bld gHb Est-mCnc: 151 mg/dL
Hgb A1c MFr Bld: 6.9 % — ABNORMAL HIGH (ref 4.8–5.6)

## 2023-08-31 LAB — TSH: TSH: 1.06 u[IU]/mL (ref 0.450–4.500)

## 2023-08-31 LAB — BRAIN NATRIURETIC PEPTIDE: BNP: 7.6 pg/mL (ref 0.0–100.0)

## 2023-10-20 ENCOUNTER — Other Ambulatory Visit: Payer: Self-pay | Admitting: Nurse Practitioner

## 2023-10-20 DIAGNOSIS — E1169 Type 2 diabetes mellitus with other specified complication: Secondary | ICD-10-CM

## 2023-11-04 ENCOUNTER — Other Ambulatory Visit: Payer: Self-pay | Admitting: Nurse Practitioner

## 2023-11-04 DIAGNOSIS — Z1231 Encounter for screening mammogram for malignant neoplasm of breast: Secondary | ICD-10-CM

## 2023-12-03 ENCOUNTER — Ambulatory Visit: Payer: Managed Care, Other (non HMO)

## 2023-12-28 ENCOUNTER — Encounter: Payer: Self-pay | Admitting: Nurse Practitioner

## 2023-12-28 ENCOUNTER — Ambulatory Visit (INDEPENDENT_AMBULATORY_CARE_PROVIDER_SITE_OTHER): Payer: Self-pay | Admitting: Nurse Practitioner

## 2023-12-28 VITALS — BP 110/80 | HR 81 | Temp 98.1°F | Ht 65.0 in | Wt 198.0 lb

## 2023-12-28 DIAGNOSIS — E1169 Type 2 diabetes mellitus with other specified complication: Secondary | ICD-10-CM

## 2023-12-28 DIAGNOSIS — Z6832 Body mass index (BMI) 32.0-32.9, adult: Secondary | ICD-10-CM

## 2023-12-28 DIAGNOSIS — E559 Vitamin D deficiency, unspecified: Secondary | ICD-10-CM | POA: Diagnosis not present

## 2023-12-28 DIAGNOSIS — E6609 Other obesity due to excess calories: Secondary | ICD-10-CM | POA: Insufficient documentation

## 2023-12-28 DIAGNOSIS — E785 Hyperlipidemia, unspecified: Secondary | ICD-10-CM | POA: Diagnosis not present

## 2023-12-28 DIAGNOSIS — Z79899 Other long term (current) drug therapy: Secondary | ICD-10-CM

## 2023-12-28 DIAGNOSIS — Z2821 Immunization not carried out because of patient refusal: Secondary | ICD-10-CM | POA: Insufficient documentation

## 2023-12-28 DIAGNOSIS — Z0000 Encounter for general adult medical examination without abnormal findings: Secondary | ICD-10-CM | POA: Diagnosis not present

## 2023-12-28 DIAGNOSIS — E66811 Obesity, class 1: Secondary | ICD-10-CM

## 2023-12-28 LAB — POCT URINALYSIS DIPSTICK
Bilirubin, UA: NEGATIVE
Glucose, UA: NEGATIVE
Ketones, UA: NEGATIVE
Leukocytes, UA: NEGATIVE
Nitrite, UA: NEGATIVE
Protein, UA: NEGATIVE
Spec Grav, UA: 1.02 (ref 1.010–1.025)
Urobilinogen, UA: 0.2 U/dL
pH, UA: 5.5 (ref 5.0–8.0)

## 2023-12-28 MED ORDER — ROSUVASTATIN CALCIUM 5 MG PO TABS: 5.0000 mg | ORAL_TABLET | Freq: Every day | ORAL | 1 refills | Status: DC

## 2023-12-28 NOTE — Progress Notes (Signed)
 I,Jameka J Llittleton, CMA,acting as a Neurosurgeon for [employer], FNP.,have documented all relevant documentation on the behalf of Arnette Felts, FNP,as directed by  Arnette Felts, FNP while in the presence of Arnette Felts, FNP.  Subjective:    Patient ID: Olivia Wall , female    DOB: Nov 11, 1975 , 48 y.o.   MRN: [id number]  Chief Complaint  Patient presents with   Annual Exam    HPI  Patient presents today for HM, patient states compliance with medications and has no other concerns.   Letter sent for diabetic eye exam  Diabetes She presents for her follow-up diabetic visit. She has type 2 diabetes mellitus. There are no hypoglycemic associated symptoms. There are no diabetic associated symptoms. There are no diabetic complications. Risk factors for coronary artery disease include diabetes mellitus and obesity. She is following a generally healthy diet. When asked about meal planning, she reported none. She has not had a previous visit with a dietitian. (Reports her blood sugars have been running good) She does not see a podiatrist.Eye exam is current.     Past Medical History:  Diagnosis Date   Abnormal Pap smear of cervix    Cancer (HCC)    Chicken pox    Diabetes mellitus without complication (HCC)    Ovarian cyst      Family History  Problem Relation Age of Onset   Cancer Mother    Multiple sclerosis Mother    Breast cancer Paternal Aunt        <50   Diabetes Maternal Grandmother    Cancer Paternal Grandmother    COPD Paternal Grandfather    Breast cancer Cousin        first cousin <50     Current Outpatient Medications:    OZEMPIC, 2 MG/DOSE, 8 MG/3ML SOPN, INJECT SUBCUTANEOUSLY 2 MG EVERY WEEK, Disp: 9 mL, Rfl: 3   Vitamin D, Ergocalciferol, (DRISDOL) 1.25 MG (50000 UNIT) CAPS capsule, TAKE 1 CAPSULE BY MOUTH TWICE  WEEKLY, Disp: 26 capsule, Rfl: 3   rosuvastatin (CRESTOR) 5 MG tablet, Take 1 tablet (5 mg total) by mouth daily., Disp: 90 tablet, Rfl: 1   No Known  Allergies    The patient states she is status post hysterectomy.  Patient's last menstrual period was 03/05/2013.. Negative for: breast discharge, breast lump(s), breast pain and breast self exam. Associated symptoms include abnormal vaginal bleeding. Pertinent negatives include abnormal bleeding (hematology), anxiety, decreased libido, depression, difficulty falling sleep, dyspareunia, history of infertility, nocturia, sexual dysfunction, sleep disturbances, urinary incontinence, urinary urgency, vaginal discharge and vaginal itching. Diet regular. The patient states her exercise level is none.   The patient's tobacco use is:  Social History   Tobacco Use  Smoking Status Former   Current packs/day: 0.00   Average packs/day: 0.5 packs/day for 15.0 years (7.5 ttl pk-yrs)   Types: Cigarettes   Start date: 03/05/1997   Quit date: 03/05/2012   Years since quitting: 11.8  Smokeless Tobacco Never  She has been exposed to passive smoke. The patient's alcohol use is:  Social History   Substance and Sexual Activity  Alcohol Use No     Review of Systems  Constitutional: Negative.   HENT: Negative.    Eyes: Negative.   Respiratory: Negative.    Cardiovascular: Negative.   Gastrointestinal: Negative.   Endocrine: Negative.   Genitourinary: Negative.   Musculoskeletal: Negative.   Skin: Negative.   Allergic/Immunologic: Negative.   Neurological: Negative.   Hematological: Negative.   Psychiatric/Behavioral: Negative.  Today's Vitals   12/28/23 0842  BP: 110/80  Pulse: 81  Temp: 98.1 F (36.7 C)  Weight: 198 lb (89.8 kg)  Height: 5\' 5"  (1.651 m)   Body mass index is 32.95 kg/m.  Wt Readings from Last 3 Encounters:  12/28/23 198 lb (89.8 kg)  08/30/23 191 lb 12.8 oz (87 kg)  04/26/23 178 lb 12.8 oz (81.1 kg)     Objective:  Physical Exam Vitals reviewed.  Constitutional:      General: She is not in acute distress.    Appearance: Normal appearance. She is  well-developed. She is obese.  HENT:     Head: Normocephalic and atraumatic.     Right Ear: Hearing, tympanic membrane, ear canal and external ear normal. There is no impacted cerumen.     Left Ear: Hearing, tympanic membrane, ear canal and external ear normal. There is no impacted cerumen.     Nose: Nose normal.     Mouth/Throat:     Mouth: Mucous membranes are moist.  Eyes:     General: Lids are normal.     Extraocular Movements: Extraocular movements intact.     Conjunctiva/sclera: Conjunctivae normal.     Pupils: Pupils are equal, round, and reactive to light.     Funduscopic exam:    Right eye: No papilledema.        Left eye: No papilledema.  Neck:     Thyroid: No thyroid mass.     Vascular: No carotid bruit.  Cardiovascular:     Rate and Rhythm: Normal rate and regular rhythm.     Pulses: Normal pulses.     Heart sounds: Normal heart sounds. No murmur heard. Pulmonary:     Effort: Pulmonary effort is normal. No respiratory distress.     Breath sounds: Normal breath sounds. No wheezing.  Chest:     Chest wall: No mass.  Breasts:    Tanner Score is 5.     Right: Normal. No mass or tenderness.     Left: Normal. No mass or tenderness.  Abdominal:     General: Abdomen is flat. Bowel sounds are normal. There is no distension.     Palpations: Abdomen is soft.     Tenderness: There is no abdominal tenderness.  Genitourinary:    Rectum: Guaiac result negative.  Musculoskeletal:        General: No swelling or tenderness. Normal range of motion.     Cervical back: Full passive range of motion without pain, normal range of motion and neck supple.     Right lower leg: No edema.     Left lower leg: No edema.  Lymphadenopathy:     Upper Body:     Right upper body: No supraclavicular, axillary or pectoral adenopathy.     Left upper body: No supraclavicular, axillary or pectoral adenopathy.  Skin:    General: Skin is warm and dry.     Capillary Refill: Capillary refill takes  less than 2 seconds.  Neurological:     General: No focal deficit present.     Mental Status: She is alert and oriented to person, place, and time.     Cranial Nerves: No cranial nerve deficit.     Sensory: No sensory deficit.     Motor: No weakness.  Psychiatric:        Mood and Affect: Mood normal.        Behavior: Behavior normal.        Thought Content: Thought content normal.  Judgment: Judgment normal.         Assessment And Plan:     Annual physical exam Assessment & Plan: Behavior modifications discussed and diet history reviewed.   Pt will continue to exercise regularly and modify diet with low GI, plant based foods and decrease intake of processed foods.  Recommend intake of daily multivitamin, Vitamin D, and calcium.  Recommend mammogram and colonoscopy for preventive screenings, as well as recommend immunizations that include influenza, TDAP    Type 2 diabetes mellitus with hyperlipidemia (HCC) Assessment & Plan: HgbA1c increased at last visit. She is now on Ozempic 2 mg weekly. I have encouraged her to increase her physical activity at least by walking daily at least 30 minutes a day. EKG done with SR HR 74  Orders: -     POCT urinalysis dipstick -     Microalbumin / creatinine urine ratio -     EKG 12-Lead -     CMP14+EGFR -     Hemoglobin A1c -     Lipid panel -     Rosuvastatin Calcium; Take 1 tablet (5 mg total) by mouth daily.  Dispense: 90 tablet; Refill: 1  Vitamin D deficiency Assessment & Plan: Will check vitamin D level and supplement as needed.    Also encouraged to spend 15 minutes in the sun daily.    Orders: -     VITAMIN D 25 Hydroxy (Vit-D Deficiency, Fractures)  Class 1 obesity due to excess calories with body mass index (BMI) of 32.0 to 32.9 in adult, unspecified whether serious comorbidity present Assessment & Plan: She is encouraged to strive for BMI less than 30 to decrease cardiac risk. Advised to aim for at least 150 minutes  of exercise per week.    Pneumococcal vaccination declined Assessment & Plan: Discussed risk of pneumonia especially with chronic health problems, aware can return call to office if would like to receive   Other long term (current) drug therapy -     CBC -     TSH     Return for 1 year physical; 4 month f/u diabetes. Patient was given opportunity to ask questions. Patient verbalized understanding of the plan and was able to repeat key elements of the plan. All questions were answered to their satisfaction.   Arnette Felts, FNP  I, Arnette Felts, FNP, have reviewed all documentation for this visit. The documentation on 12/28/23 for the exam, diagnosis, procedures, and orders are all accurate and complete.

## 2023-12-28 NOTE — Patient Instructions (Addendum)
 Health Maintenance, Female Adopting a healthy lifestyle and getting preventive care are important in promoting health and wellness. Ask your health care provider about: The right schedule for you to have regular tests and exams. Things you can do on your own to prevent diseases and keep yourself healthy. What should I know about diet, weight, and exercise? Eat a healthy diet  Eat a diet that includes plenty of vegetables, fruits, low-fat dairy products, and lean protein. Do not eat a lot of foods that are high in solid fats, added sugars, or sodium. Maintain a healthy weight Body mass index (BMI) is used to identify weight problems. It estimates body fat based on height and weight. Your health care provider can help determine your BMI and help you achieve or maintain a healthy weight. Get regular exercise Get regular exercise. This is one of the most important things you can do for your health. Most adults should: Exercise for at least 150 minutes each week. The exercise should increase your heart rate and make you sweat (moderate-intensity exercise). Do strengthening exercises at least twice a week. This is in addition to the moderate-intensity exercise. Spend less time sitting. Even light physical activity can be beneficial. Watch cholesterol and blood lipids Have your blood tested for lipids and cholesterol at 46 years of age, then have this test every 5 years. Have your cholesterol levels checked more often if: Your lipid or cholesterol levels are high. You are older than 46 years of age. You are at high risk for heart disease. What should I know about cancer screening? Depending on your health history and family history, you may need to have cancer screening at various ages. This may include screening for: Breast cancer. Cervical cancer. Colorectal cancer. Skin cancer. Lung cancer. What should I know about heart disease, diabetes, and high blood pressure? Blood pressure and heart  disease High blood pressure causes heart disease and increases the risk of stroke. This is more likely to develop in people who have high blood pressure readings or are overweight. Have your blood pressure checked: Every 3-5 years if you are 29-4 years of age. Every year if you are 27 years old or older. Diabetes Have regular diabetes screenings. This checks your fasting blood sugar level. Have the screening done: Once every three years after age 32 if you are at a normal weight and have a low risk for diabetes. More often and at a younger age if you are overweight or have a high risk for diabetes. What should I know about preventing infection? Hepatitis B If you have a higher risk for hepatitis B, you should be screened for this virus. Talk with your health care provider to find out if you are at risk for hepatitis B infection. Hepatitis C Testing is recommended for: Everyone born from [birth year] through 1963. Anyone with known risk factors for hepatitis C. Sexually transmitted infections (STIs) Get screened for STIs, including gonorrhea and chlamydia, if: You are sexually active and are younger than 46 years of age. You are older than 46 years of age and your health care provider tells you that you are at risk for this type of infection. Your sexual activity has changed since you were last screened, and you are at increased risk for chlamydia or gonorrhea. Ask your health care provider if you are at risk. Ask your health care provider about whether you are at high risk for HIV. Your health care provider may recommend a prescription medicine to help prevent HIV  infection. If you choose to take medicine to prevent HIV, you should first get tested for HIV. You should then be tested every 3 months for as long as you are taking the medicine. Pregnancy If you are about to stop having your period (premenopausal) and you may become pregnant, seek counseling before you get pregnant. Take 400 to 800  micrograms (mcg) of folic acid every day if you become pregnant. Ask for birth control (contraception) if you want to prevent pregnancy. Osteoporosis and menopause Osteoporosis is a disease in which the bones lose minerals and strength with aging. This can result in bone fractures. If you are 41 years old or older, or if you are at risk for osteoporosis and fractures, ask your health care provider if you should: Be screened for bone loss. Take a calcium or vitamin D supplement to lower your risk of fractures. Be given hormone replacement therapy (HRT) to treat symptoms of menopause. Follow these instructions at home: Alcohol use Do not drink alcohol if: Your health care provider tells you not to drink. You are pregnant, may be pregnant, or are planning to become pregnant. If you drink alcohol: Limit how much you have to: 0-1 drink a day. Know how much alcohol is in your drink. In the [place] one drink equals one 12 oz bottle of beer (355 mL), one 5 oz glass of wine (148 mL), or one 1 oz glass of hard liquor (44 mL). Lifestyle Do not use any products that contain nicotine or tobacco. These products include cigarettes, chewing tobacco, and vaping devices, such as e-cigarettes. If you need help quitting, ask your health care provider. Do not use street drugs. Do not share needles. Ask your health care provider for help if you need support or information about quitting drugs. General instructions Schedule regular health, dental, and eye exams. Stay current with your vaccines. Tell your health care provider if: You often feel depressed. You have ever been abused or do not feel safe at home. Summary Adopting a healthy lifestyle and getting preventive care are important in promoting health and wellness. Follow your health care provider's instructions about healthy diet, exercising, and getting tested or screened for diseases. Follow your health care provider's instructions on monitoring your  cholesterol and blood pressure. This information is not intended to replace advice given to you by your health care provider. Make sure you discuss any questions you have with your health care provider. Document Revised: 03/03/2021 Document Reviewed: 03/03/2021 Elsevier Patient Education  2024 [employer]  Health Maintenance  Topic Date Due   Pneumococcal Vaccination (1 of 2 - PCV) Never done   Eye exam for diabetics  11/01/2023   Yearly kidney health urinalysis for diabetes  12/24/2023   Hemoglobin A1C  02/27/2024   Complete foot exam   04/25/2024   Yearly kidney function blood test for diabetes  08/29/2024   Pap with HPV screening  12/24/2027   DTaP/Tdap/Td vaccine (4 - Td or Tdap) 09/24/2031   Colon Cancer Screening  07/01/2033   Hepatitis C Screening  Completed   HIV Screening  Completed   HPV Vaccine  Aged Out   Flu Shot  Discontinued   COVID-19 Vaccine  Discontinued

## 2023-12-28 NOTE — Assessment & Plan Note (Addendum)
 HgbA1c increased at last visit. She is now on Ozempic 2 mg weekly. I have encouraged her to increase her physical activity at least by walking daily at least 30 minutes a day. EKG done with SR HR 74

## 2023-12-28 NOTE — Assessment & Plan Note (Signed)
 Will check vitamin D level and supplement as needed.    Also encouraged to spend 15 minutes in the sun daily.

## 2023-12-28 NOTE — Assessment & Plan Note (Signed)

## 2023-12-28 NOTE — Assessment & Plan Note (Signed)
 Discussed risk of pneumonia especially with chronic health problems, aware can return call to office if would like to receive

## 2023-12-28 NOTE — Assessment & Plan Note (Signed)
 She is encouraged to strive for BMI less than 30 to decrease cardiac risk. Advised to aim for at least 150 minutes of exercise per week.

## 2023-12-29 ENCOUNTER — Encounter: Payer: Self-pay | Admitting: Nurse Practitioner

## 2023-12-29 ENCOUNTER — Other Ambulatory Visit: Payer: Self-pay | Admitting: Nurse Practitioner

## 2023-12-29 DIAGNOSIS — E559 Vitamin D deficiency, unspecified: Secondary | ICD-10-CM

## 2023-12-29 LAB — HEMOGLOBIN A1C
Est. average glucose Bld gHb Est-mCnc: 128 mg/dL
Hgb A1c MFr Bld: 6.1 % — ABNORMAL HIGH (ref 4.8–5.6)

## 2023-12-29 LAB — LIPID PANEL
Chol/HDL Ratio: 4.7 ratio — ABNORMAL HIGH (ref 0.0–4.4)
Cholesterol, Total: 194 mg/dL (ref 100–199)
HDL: 41 mg/dL (ref >39)
LDL Chol Calc (NIH): 123 mg/dL — ABNORMAL HIGH (ref 0–99)
Triglycerides: 168 mg/dL — ABNORMAL HIGH (ref 0–149)
VLDL Cholesterol Cal: 30 mg/dL (ref 5–40)

## 2023-12-29 LAB — MICROALBUMIN / CREATININE URINE RATIO
Creatinine, Urine: 84.8 mg/dL
Microalb/Creat Ratio: 21 mg/g{creat} (ref 0–29)
Microalbumin, Urine: 17.4 ug/mL

## 2023-12-29 LAB — CMP14+EGFR
ALT: 12 IU/L (ref 0–32)
AST: 11 IU/L (ref 0–40)
Albumin: 4.2 g/dL (ref 3.9–4.9)
Alkaline Phosphatase: 92 IU/L (ref 44–121)
BUN/Creatinine Ratio: 16 (ref 9–23)
BUN: 11 mg/dL (ref 6–24)
Bilirubin Total: 0.3 mg/dL (ref 0.0–1.2)
CO2: 21 mmol/L (ref 20–29)
Calcium: 9.3 mg/dL (ref 8.7–10.2)
Chloride: 101 mmol/L (ref 96–106)
Creatinine, Ser: 0.67 mg/dL (ref 0.57–1.00)
Globulin, Total: 3 g/dL (ref 1.5–4.5)
Glucose: 122 mg/dL — ABNORMAL HIGH (ref 70–99)
Potassium: 4.6 mmol/L (ref 3.5–5.2)
Sodium: 137 mmol/L (ref 134–144)
Total Protein: 7.2 g/dL (ref 6.0–8.5)
eGFR: 109 mL/min/{1.73_m2} (ref >59)

## 2023-12-29 LAB — TSH: TSH: 1.12 u[IU]/mL (ref 0.450–4.500)

## 2023-12-29 LAB — CBC
Hematocrit: 38.1 % (ref 34.0–46.6)
Hemoglobin: 12.5 g/dL (ref 11.1–15.9)
MCH: 32.8 pg (ref 26.6–33.0)
MCHC: 32.8 g/dL (ref 31.5–35.7)
MCV: 100 fL — ABNORMAL HIGH (ref 79–97)
Platelets: 353 10*3/uL (ref 150–450)
RBC: 3.81 x10E6/uL (ref 3.77–5.28)
RDW: 11.3 % — ABNORMAL LOW (ref 11.7–15.4)
WBC: 7.9 10*3/uL (ref 3.4–10.8)

## 2023-12-29 LAB — VITAMIN D 25 HYDROXY (VIT D DEFICIENCY, FRACTURES): Vit D, 25-Hydroxy: 26 ng/mL — ABNORMAL LOW (ref 30.0–100.0)

## 2023-12-29 MED ORDER — VITAMIN D (ERGOCALCIFEROL) 1.25 MG (50000 UNIT) PO CAPS: 50000.0000 [IU] | ORAL_CAPSULE | ORAL | 1 refills | Status: DC

## 2024-05-01 ENCOUNTER — Ambulatory Visit: Admitting: Nurse Practitioner

## 2024-06-17 ENCOUNTER — Other Ambulatory Visit: Payer: Self-pay | Admitting: Medical Genetics

## 2024-06-22 ENCOUNTER — Other Ambulatory Visit
Admission: RE | Admit: 2024-06-22 | Discharge: 2024-06-22 | Disposition: A | Discharge status: Home / Self Care | Payer: Self-pay | Source: Ambulatory Visit | Attending: Medical Genetics | Admitting: Medical Genetics

## 2024-06-22 ENCOUNTER — Ambulatory Visit: Admitting: Nurse Practitioner

## 2024-06-22 VITALS — BP 110/76 | HR 79 | Temp 98.6°F | Ht 65.0 in | Wt 189.6 lb

## 2024-06-22 DIAGNOSIS — E6609 Other obesity due to excess calories: Secondary | ICD-10-CM

## 2024-06-22 DIAGNOSIS — Z6832 Body mass index (BMI) 32.0-32.9, adult: Secondary | ICD-10-CM

## 2024-06-22 DIAGNOSIS — E1169 Type 2 diabetes mellitus with other specified complication: Secondary | ICD-10-CM

## 2024-06-22 DIAGNOSIS — E559 Vitamin D deficiency, unspecified: Secondary | ICD-10-CM | POA: Diagnosis not present

## 2024-06-22 DIAGNOSIS — E66811 Obesity, class 1: Secondary | ICD-10-CM

## 2024-06-22 DIAGNOSIS — E782 Mixed hyperlipidemia: Secondary | ICD-10-CM | POA: Diagnosis not present

## 2024-06-22 DIAGNOSIS — E785 Hyperlipidemia, unspecified: Secondary | ICD-10-CM | POA: Diagnosis not present

## 2024-06-22 MED ORDER — ROSUVASTATIN CALCIUM 5 MG PO TABS: 5.0000 mg | ORAL_TABLET | Freq: Every day | ORAL | 1 refills | Status: DC

## 2024-06-22 MED ORDER — OZEMPIC (2 MG/DOSE) 8 MG/3ML ~~LOC~~ SOPN: 2.0000 mg | PEN_INJECTOR | SUBCUTANEOUS | 1 refills | Status: DC

## 2024-06-22 MED ORDER — VITAMIN D (ERGOCALCIFEROL) 1.25 MG (50000 UNIT) PO CAPS
50000.0000 [IU] | ORAL_CAPSULE | ORAL | 1 refills | Status: DC
Start: 2024-06-22 — End: 2024-08-01

## 2024-06-22 NOTE — Progress Notes (Signed)
 Olivia Wall, CMA,acting as a Neurosurgeon for Olivia Ada, FNP.,have documented all relevant documentation on the behalf of Olivia Ada, FNP,as directed by  Olivia Ada, FNP while in the presence of Olivia Ada, FNP.  Subjective:  Patient ID: Olivia Wall , female    DOB: [birth year] , 48 y.o.   MRN: [id number]  Chief Complaint  Patient presents with   Diabetes    Patient presents today for a chol and dm follow up, Patient reports compliance with medication. Patient denies any chest pain, SOB, or headaches. Patient has no concerns today.     HPI Discussed the use of AI scribe software for clinical note transcription with the patient, who gave verbal consent to proceed.  History of Present Illness Olivia Wall is a 48 year old female who presents for a follow-up visit for her diabetes.  Her condition has remained stable since her last visit in March. She is currently taking vitamin D  and Ozempic , with one refill left on each. She is on a 2 mg dose of Ozempic , which she tolerates well. Her blood sugars have been stable, and she has not experienced any significant issues. Her A1c has improved from 6.9 to 6.1. She is on cholesterol medication, and her cholesterol levels have decreased slightly, although her LDL remains above the target level.  She engages in regular physical activity, including white water rafting and hiking, and has lost 9 pounds since March, now weighing 189 pounds. She had an eye exam in January at [facility] in [place], but the documentation has not yet been received by the clinic.  She does not walk barefoot and can see the bottom of her feet, which she checks regularly.      Diabetes She presents for her follow-up diabetic visit. She has type 2 diabetes mellitus. There are no hypoglycemic associated symptoms. There are no diabetic associated symptoms. There are no hypoglycemic complications. Risk factors for coronary artery disease include obesity, sedentary  lifestyle, diabetes mellitus, dyslipidemia and hypertension. Current diabetic treatment includes oral agent (monotherapy). She is following a generally healthy diet. When asked about meal planning, she reported none. (Blood sugar 170 2 hours after meals. Has been ranging 81-101) Eye exam is not current.     Past Medical History:  Diagnosis Date   Abnormal Pap smear of cervix    Cancer (HCC)    Chicken pox    Diabetes mellitus without complication (HCC)    Ovarian cyst      Family History  Problem Relation Age of Onset   Cancer Mother    Multiple sclerosis Mother    Breast cancer Paternal Aunt        <50   Diabetes Maternal Grandmother    Cancer Paternal Grandmother    COPD Paternal Grandfather    Breast cancer Cousin        first cousin <50     Current Outpatient Medications:    rosuvastatin  (CRESTOR ) 5 MG tablet, Take 1 tablet (5 mg total) by mouth daily., Disp: 90 tablet, Rfl: 1   Semaglutide , 2 MG/DOSE, (OZEMPIC , 2 MG/DOSE,) 8 MG/3ML SOPN, Inject 2 mg into the skin once a week., Disp: 9 mL, Rfl: 1   Vitamin D , Ergocalciferol , (DRISDOL ) 1.25 MG (50000 UNIT) CAPS capsule, Take 1 capsule (50,000 Units total) by mouth 2 (two) times a week., Disp: 24 capsule, Rfl: 1   No Known Allergies   Review of Systems  Constitutional: Negative.   HENT: Negative.    Eyes: Negative.  Respiratory: Negative.    Cardiovascular: Negative.   Gastrointestinal: Negative.   Musculoskeletal: Negative.   Neurological: Negative.   Psychiatric/Behavioral: Negative.       Today's Vitals   06/22/24 1144  BP: 110/76  Pulse: 79  Temp: 98.6 F (37 C)  TempSrc: Oral  Weight: 189 lb 9.6 oz (86 kg)  Height: 5' 5 (1.651 m)  PainSc: 0-No pain   Body mass index is 31.55 kg/m.  Wt Readings from Last 3 Encounters:  06/22/24 189 lb 9.6 oz (86 kg)  12/28/23 198 lb (89.8 kg)  08/30/23 191 lb 12.8 oz (87 kg)      Objective:  Physical Exam Vitals and nursing note reviewed.  Constitutional:       General: She is not in acute distress.    Appearance: Normal appearance.  Cardiovascular:     Rate and Rhythm: Normal rate and regular rhythm.     Pulses: Normal pulses.     Heart sounds: Normal heart sounds. No murmur heard. Pulmonary:     Effort: Pulmonary effort is normal. No respiratory distress.     Breath sounds: Normal breath sounds. No wheezing.  Skin:    Capillary Refill: Capillary refill takes less than 2 seconds.  Neurological:     General: No focal deficit present.     Mental Status: She is alert and oriented to person, place, and time.     Cranial Nerves: No cranial nerve deficit.     Motor: No weakness.  Psychiatric:        Mood and Affect: Mood normal.        Behavior: Behavior normal.        Thought Content: Thought content normal.        Judgment: Judgment normal.         Assessment And Plan:  Type 2 diabetes mellitus with hyperlipidemia (HCC) Assessment & Plan: Well-controlled with A1c of 6.1. Blood sugars stable, tolerates current regimen. - Check A1c, cholesterol, kidney, and liver functions. - Continue Ozempic  2 mg subcutaneously weekly. - Schedule follow-up in 4 months.  Orders: -     Hemoglobin A1c -     Lipid panel -     Ozempic  (2 MG/DOSE); Inject 2 mg into the skin once a week.  Dispense: 9 mL; Refill: 1 -     Rosuvastatin  Calcium ; Take 1 tablet (5 mg total) by mouth daily.  Dispense: 90 tablet; Refill: 1 -     CMP14+EGFR  Vitamin D  deficiency Assessment & Plan: Managed with ergocalciferol  supplementation, one refill left. - Continue Vitamin D , ergocalciferol , 1.25 mg (50,000 units) orally twice weekly. - Send refill for Vitamin D .  Orders: -     VITAMIN D  25 Hydroxy (Vit-D Deficiency, Fractures) -     Vitamin D  (Ergocalciferol ); Take 1 capsule (50,000 Units total) by mouth 2 (two) times a week.  Dispense: 24 capsule; Refill: 1 -     CMP14+EGFR  Mixed hyperlipidemia Assessment & Plan: LDL levels not at target, current LDL 123. On  rosuvastatin , consider dose increase if LDL remains elevated. - Check cholesterol levels. - Continue rosuvastatin  5 mg daily. - Consider increasing rosuvastatin  dose if LDL remains elevated.  Orders: -     Lipid panel -     CMP14+EGFR  Class 1 obesity due to excess calories with body mass index (BMI) of 32.0 to 32.9 in adult, unspecified whether serious comorbidity present Assessment & Plan: BMI 32.0-32.9, lost 9 pounds, engages in physical activities. - Encourage continued physical activity  and weight management.      Return for controlled DM check 4 months.  Patient was given opportunity to ask questions. Patient verbalized understanding of the plan and was able to repeat key elements of the plan. All questions were answered to their satisfaction.    Olivia Olivia Ada, FNP, have reviewed all documentation for this visit. The documentation on 06/22/24 for the exam, diagnosis, procedures, and orders are all accurate and complete.   IF YOU HAVE BEEN REFERRED TO A SPECIALIST, IT MAY TAKE 1-2 WEEKS TO SCHEDULE/PROCESS THE REFERRAL. IF YOU HAVE NOT HEARD FROM US /SPECIALIST IN TWO WEEKS, PLEASE GIVE US  A CALL AT [phone] X [phone].

## 2024-06-23 LAB — CMP14+EGFR
ALT: 14 IU/L (ref 0–32)
AST: 14 IU/L (ref 0–40)
Albumin: 4.2 g/dL (ref 3.9–4.9)
Alkaline Phosphatase: 90 IU/L (ref 44–121)
BUN/Creatinine Ratio: 15 (ref 9–23)
BUN: 10 mg/dL (ref 6–24)
Bilirubin Total: 0.2 mg/dL (ref 0.0–1.2)
CO2: 20 mmol/L (ref 20–29)
Calcium: 9.3 mg/dL (ref 8.7–10.2)
Chloride: 100 mmol/L (ref 96–106)
Creatinine, Ser: 0.67 mg/dL (ref 0.57–1.00)
Globulin, Total: 3 g/dL (ref 1.5–4.5)
Glucose: 134 mg/dL — ABNORMAL HIGH (ref 70–99)
Potassium: 4.3 mmol/L (ref 3.5–5.2)
Sodium: 138 mmol/L (ref 134–144)
Total Protein: 7.2 g/dL (ref 6.0–8.5)
eGFR: 109 mL/min/1.73 (ref >59)

## 2024-06-23 LAB — VITAMIN D 25 HYDROXY (VIT D DEFICIENCY, FRACTURES): Vit D, 25-Hydroxy: 25.4 ng/mL — ABNORMAL LOW (ref 30.0–100.0)

## 2024-06-23 LAB — LIPID PANEL
Chol/HDL Ratio: 5.4 ratio — ABNORMAL HIGH (ref 0.0–4.4)
Cholesterol, Total: 199 mg/dL (ref 100–199)
HDL: 37 mg/dL — ABNORMAL LOW (ref >39)
LDL Chol Calc (NIH): 113 mg/dL — ABNORMAL HIGH (ref 0–99)
Triglycerides: 284 mg/dL — ABNORMAL HIGH (ref 0–149)
VLDL Cholesterol Cal: 49 mg/dL — ABNORMAL HIGH (ref 5–40)

## 2024-06-23 LAB — HEMOGLOBIN A1C
Est. average glucose Bld gHb Est-mCnc: 126 mg/dL
Hgb A1c MFr Bld: 6 % — ABNORMAL HIGH (ref 4.8–5.6)

## 2024-07-07 ENCOUNTER — Ambulatory Visit: Payer: Self-pay | Admitting: Nurse Practitioner

## 2024-07-07 LAB — GENECONNECT MOLECULAR SCREEN: Genetic Analysis Overall Interpretation: NEGATIVE

## 2024-07-07 NOTE — Assessment & Plan Note (Signed)
 BMI 32.0-32.9, lost 9 pounds, engages in physical activities. - Encourage continued physical activity and weight management.

## 2024-07-07 NOTE — Assessment & Plan Note (Signed)
 Managed with ergocalciferol  supplementation, one refill left. - Continue Vitamin D , ergocalciferol , 1.25 mg (50,000 units) orally twice weekly. - Send refill for Vitamin D .

## 2024-07-07 NOTE — Assessment & Plan Note (Signed)
 LDL levels not at target, current LDL 123. On rosuvastatin , consider dose increase if LDL remains elevated. - Check cholesterol levels. - Continue rosuvastatin  5 mg daily. - Consider increasing rosuvastatin  dose if LDL remains elevated.

## 2024-07-07 NOTE — Assessment & Plan Note (Signed)
 Well-controlled with A1c of 6.1. Blood sugars stable, tolerates current regimen. - Check A1c, cholesterol, kidney, and liver functions. - Continue Ozempic  2 mg subcutaneously weekly. - Schedule follow-up in 4 months.

## 2024-07-14 ENCOUNTER — Other Ambulatory Visit: Payer: Self-pay

## 2024-07-14 ENCOUNTER — Encounter (HOSPITAL_BASED_OUTPATIENT_CLINIC_OR_DEPARTMENT_OTHER): Payer: Self-pay

## 2024-07-14 ENCOUNTER — Emergency Department (HOSPITAL_BASED_OUTPATIENT_CLINIC_OR_DEPARTMENT_OTHER)

## 2024-07-14 ENCOUNTER — Observation Stay (HOSPITAL_BASED_OUTPATIENT_CLINIC_OR_DEPARTMENT_OTHER)
Admission: EM | Admit: 2024-07-14 | Discharge: 2024-07-16 | Disposition: A | Discharge status: Home / Self Care | Attending: Internal Medicine | Admitting: Internal Medicine

## 2024-07-14 DIAGNOSIS — E119 Type 2 diabetes mellitus without complications: Secondary | ICD-10-CM | POA: Insufficient documentation

## 2024-07-14 DIAGNOSIS — Z8673 Personal history of transient ischemic attack (TIA), and cerebral infarction without residual deficits: Secondary | ICD-10-CM | POA: Diagnosis present

## 2024-07-14 DIAGNOSIS — Z7982 Long term (current) use of aspirin: Secondary | ICD-10-CM | POA: Insufficient documentation

## 2024-07-14 DIAGNOSIS — R202 Paresthesia of skin: Principal | ICD-10-CM | POA: Insufficient documentation

## 2024-07-14 DIAGNOSIS — I639 Cerebral infarction, unspecified: Principal | ICD-10-CM | POA: Diagnosis present

## 2024-07-14 LAB — CBC
HCT: 38.8 % (ref 36.0–46.0)
Hemoglobin: 13.1 g/dL (ref 12.0–15.0)
MCH: 33.7 pg (ref 26.0–34.0)
MCHC: 33.8 g/dL (ref 30.0–36.0)
MCV: 99.7 fL (ref 80.0–100.0)
Platelets: 316 K/uL (ref 150–400)
RBC: 3.89 MIL/uL (ref 3.87–5.11)
RDW: 11.5 % (ref 11.5–15.5)
WBC: 12.1 K/uL — ABNORMAL HIGH (ref 4.0–10.5)
nRBC: 0 % (ref 0.0–0.2)

## 2024-07-14 LAB — URINE DRUG SCREEN
Amphetamines: NEGATIVE
Barbiturates: NEGATIVE
Benzodiazepines: NEGATIVE
Cocaine: NEGATIVE
Fentanyl: NEGATIVE
Methadone Scn, Ur: NEGATIVE
Opiates: NEGATIVE
Tetrahydrocannabinol: NEGATIVE

## 2024-07-14 LAB — ETHANOL: Alcohol, Ethyl (B): <15 mg/dL (ref <15)

## 2024-07-14 LAB — DIFFERENTIAL
Abs Immature Granulocytes: 0.03 K/uL (ref 0.00–0.07)
Basophils Absolute: 0 K/uL (ref 0.0–0.1)
Basophils Relative: 0 %
Eosinophils Absolute: 0.1 K/uL (ref 0.0–0.5)
Eosinophils Relative: 1 %
Immature Granulocytes: 0 %
Lymphocytes Relative: 23 %
Lymphs Abs: 2.7 K/uL (ref 0.7–4.0)
Monocytes Absolute: 0.8 K/uL (ref 0.1–1.0)
Monocytes Relative: 6 %
Neutro Abs: 8.4 K/uL — ABNORMAL HIGH (ref 1.7–7.7)
Neutrophils Relative %: 70 %

## 2024-07-14 LAB — PROTIME-INR
INR: 0.9 (ref 0.8–1.2)
Prothrombin Time: 12.3 s (ref 11.4–15.2)

## 2024-07-14 LAB — COMPREHENSIVE METABOLIC PANEL WITH GFR
ALT: 13 U/L (ref 0–44)
AST: 16 U/L (ref 15–41)
Albumin: 4.5 g/dL (ref 3.5–5.0)
Alkaline Phosphatase: 92 U/L (ref 38–126)
Anion gap: 12 (ref 5–15)
BUN: 9 mg/dL (ref 6–20)
CO2: 23 mmol/L (ref 22–32)
Calcium: 9.6 mg/dL (ref 8.9–10.3)
Chloride: 101 mmol/L (ref 98–111)
Creatinine, Ser: 0.78 mg/dL (ref 0.44–1.00)
GFR, Estimated: >60 mL/min (ref >60)
Glucose, Bld: 178 mg/dL — ABNORMAL HIGH (ref 70–99)
Potassium: 4 mmol/L (ref 3.5–5.1)
Sodium: 136 mmol/L (ref 135–145)
Total Bilirubin: 0.2 mg/dL (ref 0.0–1.2)
Total Protein: 7.8 g/dL (ref 6.5–8.1)

## 2024-07-14 LAB — APTT: aPTT: 26 s (ref 24–36)

## 2024-07-14 MED ORDER — IOHEXOL 350 MG/ML SOLN
100.0000 mL | Freq: Once | INTRAVENOUS | Status: AC | PRN
  Administered 2024-07-14: 100 mL via INTRAVENOUS

## 2024-07-14 MED ORDER — CLOPIDOGREL BISULFATE 300 MG PO TABS
300.0000 mg | ORAL_TABLET | Freq: Once | ORAL | Status: AC
  Administered 2024-07-14: 300 mg via ORAL
  Filled 2024-07-14: qty 1

## 2024-07-14 MED ORDER — ASPIRIN 81 MG PO CHEW
81.0000 mg | CHEWABLE_TABLET | Freq: Once | ORAL | Status: AC
Start: 2024-07-14 — End: 2024-07-14
  Administered 2024-07-14: 81 mg via ORAL
  Filled 2024-07-14: qty 1

## 2024-07-14 NOTE — ED Triage Notes (Signed)
 Pt c/o R side of face, chest, R arm numbness onset approx 1hr ago. Like icy hot or novocaine on your body. Denies HA, blurred vision, NV

## 2024-07-14 NOTE — Progress Notes (Signed)
 Plan of Care Note for accepted transfer   Patient: Olivia Wall MRN: [id number]   DOA: 07/14/2024  Facility requesting transfer: [facility]   Requesting Provider: Dr. Lenor   Reason for transfer: Acute ischemic CVA   Facility course: 48 yr old female with DM and HLD presents with acute-onset right-sided numbness and has ischemic penumbra involving left frontal lobe on CT perfusion study. Teleneuro recommends DAPT and CVA workup.   Plan of care: The patient is accepted for admission to Telemetry unit, at [facility].   Author: Evalene GORMAN Sprinkles, MD 07/14/2024  Check [email or web address] for on-call coverage.  Nursing staff, Please call [facility] Admits & Consults System-Wide number on Amion as soon as patient's arrival, so appropriate admitting provider can evaluate the pt.

## 2024-07-14 NOTE — ED Provider Notes (Signed)
 [place] EMERGENCY DEPARTMENT AT [facility] Provider Note   CSN: [id number] Arrival date & time: 07/14/24  1953  An emergency department physician performed an initial assessment on this suspected stroke patient at 2005.  Patient presents with: Numbness and Code Stroke   Olivia Wall is a 48 y.o. female.   Patient is a 48 year old female with a history of diabetes and hyperlipidemia who presents with numbness to her right face and right arm with some weakness in her arm.  She says it started 1 hour prior to arrival.  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  She says she feels numb in the right side of her face and in her right arm.  No leg involvement.  She feels like her right arm is weaker than normal.  She has had a headache for the last couple of days.  She denies any vision changes.  No speech deficits.  No history of similar symptoms in the past.  She did feel little bit of right sided facial twitching on the way over here.       Prior to Admission medications   Medication Sig Start Date End Date Taking? Authorizing Provider  rosuvastatin  (CRESTOR ) 5 MG tablet Take 1 tablet (5 mg total) by mouth daily. 06/22/24   Georgina Speaks, FNP  Semaglutide , 2 MG/DOSE, (OZEMPIC , 2 MG/DOSE,) 8 MG/3ML SOPN Inject 2 mg into the skin once a week. 06/22/24   Georgina Speaks, FNP  Vitamin D , Ergocalciferol , (DRISDOL ) 1.25 MG (50000 UNIT) CAPS capsule Take 1 capsule (50,000 Units total) by mouth 2 (two) times a week. 06/22/24   Georgina Speaks, FNP    Allergies: Patient has no known allergies.    Review of Systems  Constitutional:  Negative for chills, diaphoresis, fatigue and fever.  HENT:  Negative for congestion, rhinorrhea and sneezing.   Eyes: Negative.   Respiratory:  Negative for cough, chest tightness and shortness of breath.   Cardiovascular:  Negative for chest pain and leg swelling.  Gastrointestinal:  Negative for abdominal pain, blood in stool,  diarrhea, nausea and vomiting.  Genitourinary:  Negative for difficulty urinating, flank pain, frequency and hematuria.  Musculoskeletal:  Negative for arthralgias and back pain.  Skin:  Negative for rash.  Neurological:  Positive for weakness, numbness and headaches. Negative for dizziness and speech difficulty.    Updated Vital Signs BP (!) 149/80 (BP Location: Right Arm)   Pulse 93   Temp 98.3 F (36.8 C)   Resp 17   LMP 03/05/2013   SpO2 100%   Physical Exam Constitutional:      Appearance: She is well-developed.  HENT:     Head: Normocephalic and atraumatic.  Eyes:     Pupils: Pupils are equal, round, and reactive to light.  Cardiovascular:     Rate and Rhythm: Normal rate and regular rhythm.     Heart sounds: Normal heart sounds.  Pulmonary:     Effort: Pulmonary effort is normal. No respiratory distress.     Breath sounds: Normal breath sounds. No wheezing or rales.  Chest:     Chest wall: No tenderness.  Abdominal:     General: Bowel sounds are normal.     Palpations: Abdomen is soft.     Tenderness: There is no abdominal tenderness. There is no guarding or rebound.  Musculoskeletal:        General: Normal range of motion.     Cervical back: Normal range of motion and neck supple.  Lymphadenopathy:     Cervical: No cervical adenopathy.  Skin:    General: Skin is warm and dry.     Findings: No rash.  Neurological:     Mental Status: She is alert and oriented to person, place, and time.     Comments: Decrease sensation to the right face as well as the right upper extremity.  Normal sensation to the lower extremities.  Some slight weakness in the right arm as compared to the left.  No facial drooping.     (all labs ordered are listed, but only abnormal results are displayed) Labs Reviewed  CBC - Abnormal; Notable for the following components:      Result Value   WBC 12.1 (*)    All other components within normal limits  DIFFERENTIAL - Abnormal; Notable for  the following components:   Neutro Abs 8.4 (*)    All other components within normal limits  COMPREHENSIVE METABOLIC PANEL WITH GFR - Abnormal; Notable for the following components:   Glucose, Bld 178 (*)    All other components within normal limits  ETHANOL  PROTIME-INR  APTT  URINE DRUG SCREEN    EKG: None  Radiology: CT ANGIO HEAD NECK W WO CM W PERF (CODE STROKE) Result Date: 07/14/2024 EXAM: CTA Head and Neck with Perfusion 07/14/2024 08:51:42 PM TECHNIQUE: CTA of the head and neck was performed with the administration of intravenous contrast. 3D postprocessing with multiplanar reconstructions and MIPs was performed to evaluate the vascular anatomy. Cerebral perfusion analysis using computed tomography with contrast administration, including post-processing of parametric maps with determination of cerebral blood flow, cerebral blood volume, mean transit time and time-to-maximum. Automated exposure control, iterative reconstruction, and/or weight based adjustment of the mA/kV was utilized to reduce the radiation dose to as low as reasonably achievable. COMPARISON: None available CLINICAL HISTORY: Neuro deficit, acute, stroke suspected; Right sided weakness and numbness. FINDINGS: CTA NECK: AORTIC ARCH AND ARCH VESSELS: No dissection or arterial injury. No significant stenosis of the brachiocephalic or subclavian arteries. CERVICAL CAROTID ARTERIES: No dissection, arterial injury, or hemodynamically significant stenosis by NASCET criteria. CERVICAL VERTEBRAL ARTERIES: No dissection, arterial injury, or significant stenosis. LUNGS AND MEDIASTINUM: Unremarkable. SOFT TISSUES: No acute abnormality. BONES: No acute abnormality. CTA HEAD: ANTERIOR CIRCULATION: No significant stenosis of the internal carotid arteries. No significant stenosis of the anterior cerebral arteries. No significant stenosis of the middle cerebral arteries. No aneurysm. POSTERIOR CIRCULATION: No significant stenosis of the  posterior cerebral arteries. No significant stenosis of the basilar artery. No significant stenosis of the vertebral arteries. No aneurysm. OTHER: No dural venous sinus thrombosis on this non-dedicated study. CT PERFUSION: EXAM QUALITY: Exam quality is adequate with diagnostic perfusion maps. No significant motion artifact. Appropriate arterial inflow and venous outflow curves. CORE INFARCT (CBF<30% volume): 0 mL TOTAL HYPOPERFUSION (Tmax>6s volume): 4 mL PENUMBRA: Mismatch volume: 4 mL Mismatch ratio: Not applicable Location: Inferior left frontal lobe IMPRESSION: 1. Findings suggesting 4mL acute ischemic penumbra in the inferior left frontal lobe. Correlation with MRI may be helpful for confirmation. 2. No acute large vessel occlusion. 3. No hemodynamically significant stenosis or aneurysm in the head or neck vessels. Electronically signed by: Franky Stanford MD 07/14/2024 09:06 PM EDT RP Workstation: [id number]   CT HEAD CODE STROKE WO CONTRAST Result Date: 07/14/2024 EXAM: CT HEAD WITHOUT CONTRAST 07/14/2024 08:15:43 PM TECHNIQUE: CT of the head was performed without the administration of intravenous contrast. Automated exposure control, iterative reconstruction, and/or weight based adjustment of the mA/kV was utilized  to reduce the radiation dose to as low as reasonably achievable. COMPARISON: None available. CLINICAL HISTORY: Neuro deficit, acute, stroke suspected. Pt c/o R side of face, chest, R arm numbness onset approx 1hr ago. Like icy hot or novocaine on your body. Denies HA, blurred vision, NV FINDINGS: BRAIN AND VENTRICLES: No acute hemorrhage. No evidence of acute infarct. No hydrocephalus. No extra-axial collection. No mass effect or midline shift. ASPECTS is 10. ORBITS: No acute abnormality. SINUSES: No acute abnormality. SOFT TISSUES AND SKULL: No acute soft tissue abnormality. No skull fracture. IMPRESSION: 1. No acute intracranial abnormality. Findings discussed with Dr. Lenor at 8:26 pm on  07/14/2024 . Electronically signed by: Franky Stanford MD 07/14/2024 08:27 PM EDT RP Workstation: [id number]     Procedures   Medications Ordered in the ED  iohexol  (OMNIPAQUE ) 350 MG/ML injection 100 mL (100 mLs Intravenous Contrast Given 07/14/24 2051)  clopidogrel  (PLAVIX ) tablet 300 mg (300 mg Oral Given 07/14/24 2146)  aspirin  chewable tablet 81 mg (81 mg Oral Given 07/14/24 2146)                                    Medical Decision Making Amount and/or Complexity of Data Reviewed Labs: ordered. Radiology: ordered.  Risk OTC drugs. Prescription drug management. Decision regarding hospitalization.   This patient presents to the ED for concern of right side numbness and weakness, this involves an extensive number of treatment options, and is a complaint that carries with it a high risk of complications and morbidity.  I considered the following differential and admission for this acute, potentially life threatening condition.  The differential diagnosis includes acute stroke, dissection, complex migraine, nerve impingement, venous sinus thrombosis  MDM:    Patient is a 48 year old who presents with numbness to the right face as well as numbness and weakness to the right arm.  Code stroke was activated in triage.  She was evaluated by neurology.  Her symptoms were mild and did not meet criteria for thrombolytics.  EKG shows a sinus rhythm.  Noncontrast head CT did not show any acute abnormality.  CTA of the head and neck does not show any vascular occlusion or dissection.  However per the radiologist, there is a questionable area of acute infarct and the perfusion scan.  Patient was given 81 mg aspirin  and 300 mg of Plavix  per neurology recommendation.  It was recommended by the neurologist that she be admitted for further stroke workup.  Discussed with Dr. Charlton who will admit the patient for further treatment.  CRITICAL CARE Performed by: Andrea Lenor Total critical care time: 70  minutes Critical care time was exclusive of separately billable procedures and treating other patients. Critical care was necessary to treat or prevent imminent or life-threatening deterioration. Critical care was time spent personally by me on the following activities: development of treatment plan with patient and/or surrogate as well as nursing, discussions with consultants, evaluation of patient's response to treatment, examination of patient, obtaining history from patient or surrogate, ordering and performing treatments and interventions, ordering and review of laboratory studies, ordering and review of radiographic studies, pulse oximetry and re-evaluation of patient's condition.   (Labs, imaging, consults)  Labs: I Ordered, and personally interpreted labs.  The pertinent results include: Mild elevation in her WBC count, mildly elevated glucose  Imaging Studies ordered: I ordered imaging studies including CT head, CTA head and neck I independently visualized and interpreted imaging.  I agree with the radiologist interpretation  Additional history obtained from chart.  External records from outside source obtained and reviewed including prior notes  Cardiac Monitoring: The patient was maintained on a cardiac monitor.  If on the cardiac monitor, I personally viewed and interpreted the cardiac monitored which showed an underlying rhythm of: Sinus rhythm  Reevaluation: After the interventions noted above, I reevaluated the patient and found that they have :improved  Social Determinants of Health:    Disposition: Admit to hospital  Co morbidities that complicate the patient evaluation  Past Medical History:  Diagnosis Date   Abnormal Pap smear of cervix    Cancer (HCC)    Chicken pox    Diabetes mellitus without complication (HCC)    Ovarian cyst      Medicines Meds ordered this encounter  Medications   iohexol  (OMNIPAQUE ) 350 MG/ML injection 100 mL   clopidogrel  (PLAVIX )  tablet 300 mg   aspirin  chewable tablet 81 mg    I have reviewed the patients home medicines and have made adjustments as needed  Problem List / ED Course: Problem List Items Addressed This Visit       Cardiovascular and Mediastinum   * (Principal) Acute ischemic stroke [facility]) - Primary             Final diagnoses:  Acute ischemic stroke [facility])    ED Discharge Orders     None          Lenor Hollering, MD 07/14/24 2222

## 2024-07-14 NOTE — ED Notes (Signed)
 EDP at bedside, code stroke/ teleneuro activated at this time

## 2024-07-14 NOTE — Consult Note (Addendum)
 [facility] Consult Services   Patient Name:   Olivia Wall, Olivia Wall Date of Birth:   04/15/1976 Identification Number:   MRN - [id number] Date of Service:   07/14/2024 20:06:54   Addendum: CTA head and neck showed no large vessel occlusion. There was a small 4 ml perfusion deficit in the left anterior lobe which could be artifact vs acute ischemic. Pt is not a candidate for mechanical thrombectomy.  No change in recommendations.   Sari Daring, M.D.  Neurology      Diagnosis:       R20.8 - Other disturbances of skin sensation       Right sided weakness   Impression:      Pt is a 48 yo female with diabetes who presents to the ED with right arm weakness and right face and arm numbness since 19:00. NIHSS was 1 for diminished sensation on the right face, arm and leg.  Because pt was within the window for iv-thrombolytics with no contraindications, the role of TNK as well as the risks and benefits were discussed with patient.  Decision was made not to give TNK due to her mild, non-disabling deficits.  CTA head and neck and CT perfusion are pending, will follow-up with the results.    If CTA head and neck show no large vessel occlusion, recommend stroke work-up with a routine MRI brain without contrast, telemetry, TTE (with bubble study if available), lipid panel, hemoglobin AIC.    Would start pt on DAPT with Plavix  300 mg and ASA 81 mg x 1 in the ED once the patient passes a dysphagia screen or swallow evaluation followed by ASA 81 mg daily and Plavix  75 mg daily. Would continue DAPT x 21 days, then stop Plavix  and just continue ASA.  Our recommendations are outlined below.  Recommendations:        Stroke/Telemetry Floor       Neuro Checks (Q4)       Bedside Swallow Eval       DVT Prophylaxis       IV Fluids, Normal Saline       Head of Bed 30 Degrees       Euglycemia and Avoid Hyperthermia (PRN Acetaminophen )       Antihypertensives PRN if Blood  pressure is greater than 220/120 or there is a concern for End organ damage/contraindications for permissive HTN. If blood pressure is greater than 220/120 give labetalol PO or IV or Vasotec IV with a goal of 15% reduction in BP during the first 24 hours.       MRI brain without contrast       TTE (with bubble study if available)       Lipid panel       Hemoglobin AIC        Start pt on DAPT with Plavix  300 mg and ASA 81 mg x 1 in the ED once the patient passes a dysphagia screen or swallow evaluation followed by ASA 81 mg daily and Plavix  75 mg daily. Would continue DAPT x 21 days, then stop Plavix  and just continue ASA.  Sign Out:       Discussed with Emergency Department Provider    ------------------------------------------------------------------------------  Metrics: Last Known Well: 07/14/2024 19:00:00 Dispatch Time: 07/14/2024 20:06:54 Arrival Time: 07/14/2024 19:53:00 Initial Response Time: 07/14/2024 20:10:37 Symptoms: Right sided weakness and numbness. . Initial patient interaction: 07/14/2024 20:16:24 NIHSS Assessment Completed: 07/14/2024 20:26:54 Patient is not a candidate for Thrombolytic. Thrombolytic Medical Decision: 07/14/2024 20:29:31 Patient  was not deemed candidate for Thrombolytic because of following reasons: Stroke severity too mild (non-disabling) . Patient/Family declined .  CT Head: I personally reviewed all the CT images that were available to me and it showed: no acute ischemic changes, no ICH  Primary Provider Notified of Diagnostic Impression and Management Plan on: 07/14/2024 20:42:09    ------------------------------------------------------------------------------  History of Present Illness: Patient is a 48 year old Female.  Patient was brought by private transportation with symptoms of Right sided weakness and numbness. . Pt is a 48 yo female with diabetes who presents to the ED with right sided weakness and numbness. At about 19:0 tonight  pt suddenly developed numbness along her right forearm and right side of her face as well as some wekaness of her right arm. She had no weakness or numbness of her leg. She reports some slight right sided neck pain. NO headache, difficulty speaking, visual changes.   Past Medical History:      Diabetes Mellitus  Medications:  No Anticoagulant use  No Antiplatelet use Reviewed EMR for current medications  Allergies:  NKDA  Social History: Smoking: Yes  Family History:  There is no family history of premature cerebrovascular disease pertinent to this consultation  ROS : 14 Points Review of Systems was performed and was negative except mentioned in HPI.  Past Surgical History: There Is No Surgical History Contributory To Today's Visit    Examination: BP(148/80), Pulse(93), 1A: Level of Consciousness - Alert; keenly responsive + 0 1B: Ask Month and Age - Both Questions Right + 0 1C: Blink Eyes & Squeeze Hands - Performs Both Tasks + 0 2: Test Horizontal Extraocular Movements - Normal + 0 3: Test Visual Fields - No Visual Loss + 0 4: Test Facial Palsy (Use Grimace if Obtunded) - Normal symmetry + 0 5A: Test Left Arm Motor Drift - No Drift for 10 Seconds + 0 5B: Test Right Arm Motor Drift - No Drift for 10 Seconds + 0 6A: Test Left Leg Motor Drift - No Drift for 5 Seconds + 0 6B: Test Right Leg Motor Drift - No Drift for 5 Seconds + 0 7: Test Limb Ataxia (FNF/Heel-Shin) - No Ataxia + 0 8: Test Sensation - Normal; No sensory loss + 0 9: Test Language/Aphasia - Normal; No aphasia + 0 10: Test Dysarthria - Mild-Moderate Dysarthria: Slurring but can be understood + 1 11: Test Extinction/Inattention - No abnormality + 0  NIHSS Score: 1  NIHSS Free Text : NIHSS was 1 for diminished sensation on the right face, arm and leg.  Grip was equal and fine motor movements were intact.      Pre-Morbid Modified Rankin Scale: 0 Points = No symptoms at all  Spoke with : Dr Andrea Ness I reviewed the available imaging via Rapid and initiated discussion with the primary provider  This consult was conducted in real time using interactive audio and video technology. Patient was informed of the technology being used for this visit and agreed to proceed. Patient located in hospital and provider located at home/office setting.   Patient is being evaluated for possible acute neurologic impairment and high probability of imminent or life-threatening deterioration. I spent total of 48 minutes providing care to this patient, including time for face to face visit via telemedicine, review of medical records, imaging studies and discussion of findings with providers, the patient and/or family.    Dr Sari Daring   [facility] For Inpatient follow-up with [facility] physician please call [facility]  at [phone]. As we are not an outpatient service for any post hospital discharge needs please contact the hospital for assistance. If you have any questions for the [facility] physicians or need to reconsult for clinical or diagnostic changes please contact us  via [facility] at [phone].   Signature : Sari Daring

## 2024-07-15 ENCOUNTER — Observation Stay (HOSPITAL_COMMUNITY)

## 2024-07-15 DIAGNOSIS — I639 Cerebral infarction, unspecified: Secondary | ICD-10-CM | POA: Diagnosis present

## 2024-07-15 DIAGNOSIS — I959 Hypotension, unspecified: Secondary | ICD-10-CM | POA: Diagnosis not present

## 2024-07-15 DIAGNOSIS — R531 Weakness: Secondary | ICD-10-CM | POA: Diagnosis not present

## 2024-07-15 DIAGNOSIS — Z743 Need for continuous supervision: Secondary | ICD-10-CM | POA: Diagnosis not present

## 2024-07-15 LAB — HEMOGLOBIN A1C
Hgb A1c MFr Bld: 5.6 % (ref 4.8–5.6)
Mean Plasma Glucose: 114.02 mg/dL

## 2024-07-15 LAB — GLUCOSE, CAPILLARY
Glucose-Capillary: 100 mg/dL — ABNORMAL HIGH (ref 70–99)
Glucose-Capillary: 189 mg/dL — ABNORMAL HIGH (ref 70–99)

## 2024-07-15 LAB — HIV ANTIBODY (ROUTINE TESTING W REFLEX): HIV Screen 4th Generation wRfx: NONREACTIVE

## 2024-07-15 MED ORDER — INSULIN ASPART 100 UNIT/ML IJ SOLN
0.0000 [IU] | Freq: Three times a day (TID) | INTRAMUSCULAR | Status: DC
  Administered 2024-07-16: 5 [IU] via SUBCUTANEOUS

## 2024-07-15 MED ORDER — ROSUVASTATIN CALCIUM 5 MG PO TABS
5.0000 mg | ORAL_TABLET | Freq: Every day | ORAL | Status: DC
Start: 2024-07-15 — End: 2024-07-16
  Administered 2024-07-15: 5 mg via ORAL
  Filled 2024-07-15: qty 1

## 2024-07-15 MED ORDER — ACETAMINOPHEN 325 MG PO TABS
650.0000 mg | ORAL_TABLET | Freq: Once | ORAL | Status: AC
Start: 2024-07-15 — End: 2024-07-15
  Administered 2024-07-15: 650 mg via ORAL
  Filled 2024-07-15: qty 2

## 2024-07-15 MED ORDER — STROKE: EARLY STAGES OF RECOVERY BOOK
Freq: Once | Status: AC
  Filled 2024-07-15: qty 1

## 2024-07-15 MED ORDER — DIAZEPAM 5 MG/ML IJ SOLN
2.5000 mg | Freq: Once | INTRAMUSCULAR | Status: AC
  Administered 2024-07-15: 2.5 mg via INTRAVENOUS
  Filled 2024-07-15: qty 2

## 2024-07-15 MED ORDER — ASPIRIN 81 MG PO CHEW
81.0000 mg | CHEWABLE_TABLET | Freq: Every day | ORAL | Status: DC
  Administered 2024-07-16: 81 mg via ORAL
  Filled 2024-07-15: qty 1

## 2024-07-15 MED ORDER — CLOPIDOGREL BISULFATE 75 MG PO TABS
75.0000 mg | ORAL_TABLET | Freq: Every day | ORAL | Status: DC
  Administered 2024-07-16: 75 mg via ORAL
  Filled 2024-07-15: qty 1

## 2024-07-15 NOTE — ED Notes (Signed)
 She has just ambulated to b.r. and back without any problem. She denies any dizziness and is oriented x 4 with clear speech.

## 2024-07-15 NOTE — ED Notes (Signed)
 Pt reports R side sensation returning to normal, previously experiencing numbness to R side of face, RUE, and RLE

## 2024-07-15 NOTE — Progress Notes (Signed)
 SLP Cancellation Note  Patient Details Name: Olivia Wall MRN: [id number] DOB: October 23, 1976   Cancelled treatment:       Reason Eval/Treat Not Completed: SLP screened, no needs identified, will sign off. Pt OTF but talked with the pt's husband who supports a full return to baseline and no speech-language or cognitive needs at this time. Please reconsult if further needs arise.  Manuelita Blew M.S. CCC-SLP

## 2024-07-15 NOTE — ED Notes (Signed)
-  Called [facility] for transportation to [facility].

## 2024-07-15 NOTE — Progress Notes (Signed)
 PT Cancellation Note  Patient Details Name: JEMIMAH CRESSY MRN: [id number] DOB: 1976/04/12   Cancelled Treatment:    Reason Eval/Treat Not Completed: Patient at procedure or test/unavailable (Off unit for procedure. Acute PT to re-attempt as schedule allows.)  Zamari Vea W, PT, DPT Secure Chat Preferred  Rehab Office [phone]  Kate BRAVO [name] 07/15/2024, 4:11 PM

## 2024-07-15 NOTE — H&P (Signed)
 History and Physical    Patient: Olivia Wall DOB: 02-19-1976 DOA: 07/14/2024 DOS: the patient was seen and examined on 07/15/2024 PCP: Georgina Speaks, FNP  Patient coming from: Home  Chief Complaint:  Chief Complaint  Patient presents with   Numbness   Code Stroke   HPI: Olivia Wall is a 48 y.o. female with medical history significant of stage 0 cervical cancer s/p hysterectomy in 2017, and diabetes p/w acute stroke.  The patient experienced numbness in the right side that started around 1900 the previous evening. The sensation was described as similar to having a Novocaine effect, localized to the right side. The patient also reported twitching in the face and pain in the same area. There were no changes in right eye vision, no speech difficulties, and no weakness in the right arm or leg noted. After initially visiting urgent care, where it was recommended to go to the emergency department, the patient was informed that a head scan suggested the possibility of a stroke. The patient did not report any history of similar symptoms before this incident.  In the [facility] ED, pt AFVSS. Labs unremarkable. CTA head/neck showed 4mL acute ischemic penumbra in the inferior left frontal lobe. EDP consulted Neurology who recommended transfer to [facility] for MRI brain and futher evaluation.   Review of Systems: As mentioned in the history of present illness. All other systems reviewed and are negative. Past Medical History:  Diagnosis Date   Abnormal Pap smear of cervix    Cancer (HCC)    Chicken pox    Diabetes mellitus without complication (HCC)    Ovarian cyst    Past Surgical History:  Procedure Laterality Date   ABDOMINAL HYSTERECTOMY     CESAREAN SECTION  1994   COSMETIC SURGERY  1/21   tummy tuck   LAPAROSCOPIC BILATERAL SALPINGECTOMY Bilateral 05/07/2016   Procedure: LAPAROSCOPIC BILATERAL SALPINGECTOMY;  Surgeon: Mitzie BROCKS Ward, MD;  Location: [facility] ORS;  Service:  Gynecology;  Laterality: Bilateral;   LAPAROSCOPIC HYSTERECTOMY N/A 05/07/2016   Procedure: HYSTERECTOMY TOTAL LAPAROSCOPIC;  Surgeon: Mitzie BROCKS Ward, MD;  Location: [facility] ORS;  Service: Gynecology;  Laterality: N/A;   LEEP N/A 03/13/2016   Procedure: LOOP ELECTROSURGICAL EXCISION PROCEDURE (LEEP);  Surgeon: Mitzie BROCKS Ward, MD;  Location: [facility] ORS;  Service: Gynecology;  Laterality: N/A;   SHOULDER SURGERY Right 2014   Social History:  reports that she quit smoking about 12 years ago. Her smoking use included cigarettes. She started smoking about 27 years ago. She has a 7.5 pack-year smoking history. She has never used smokeless tobacco. She reports that she does not drink alcohol and does not use drugs.  No Known Allergies  Family History  Problem Relation Age of Onset   Cancer Mother    Multiple sclerosis Mother    Breast cancer Paternal Aunt        <50   Diabetes Maternal Grandmother    Cancer Paternal Grandmother    COPD Paternal Grandfather    Breast cancer Cousin        first cousin <50    Prior to Admission medications   Medication Sig Start Date End Date Taking? Authorizing Provider  rosuvastatin  (CRESTOR ) 5 MG tablet Take 1 tablet (5 mg total) by mouth daily. 06/22/24   Georgina Speaks, FNP  Semaglutide , 2 MG/DOSE, (OZEMPIC , 2 MG/DOSE,) 8 MG/3ML SOPN Inject 2 mg into the skin once a week. 06/22/24   Georgina Speaks, FNP  Vitamin D , Ergocalciferol , (DRISDOL ) 1.25 MG (50000  UNIT) CAPS capsule Take 1 capsule (50,000 Units total) by mouth 2 (two) times a week. 06/22/24   Georgina Speaks, FNP    Physical Exam: Vitals:   07/15/24 1154 07/15/24 1156 07/15/24 1416 07/15/24 1435  BP:  98/66 110/64 110/64  Pulse:  87 61 61  Resp:  (!) 22 19 19   Temp: 98.2 F (36.8 C)  98 F (36.7 C) 98 F (36.7 C)  TempSrc: Oral  Oral Oral  SpO2:  98% 97%   Height:    5' 3 (1.6 m)   General: Alert, oriented x3, resting comfortably in no acute distress Respiratory: Lungs clear to auscultation  bilaterally with normal respiratory effort; no w/r/r Cardiovascular: Regular rate and rhythm w/o m/r/g   Data Reviewed:  Lab Results  Component Value Date   WBC 12.1 (H) 07/14/2024   HGB 13.1 07/14/2024   HCT 38.8 07/14/2024   MCV 99.7 07/14/2024   PLT 316 07/14/2024   Lab Results  Component Value Date   GLUCOSE 178 (H) 07/14/2024   CALCIUM  9.6 07/14/2024   NA 136 07/14/2024   K 4.0 07/14/2024   CO2 23 07/14/2024   CL 101 07/14/2024   BUN 9 07/14/2024   CREATININE 0.78 07/14/2024   Lab Results  Component Value Date   ALT 13 07/14/2024   AST 16 07/14/2024   ALKPHOS 92 07/14/2024   BILITOT 0.2 07/14/2024   Lab Results  Component Value Date   INR 0.9 07/14/2024   INR 1.0 10/23/2019   INR 0.9 03/15/2013   Radiology: CT ANGIO HEAD NECK W WO CM W PERF (CODE STROKE) Result Date: 07/14/2024 EXAM: CTA Head and Neck with Perfusion 07/14/2024 08:51:42 PM TECHNIQUE: CTA of the head and neck was performed with the administration of intravenous contrast. 3D postprocessing with multiplanar reconstructions and MIPs was performed to evaluate the vascular anatomy. Cerebral perfusion analysis using computed tomography with contrast administration, including post-processing of parametric maps with determination of cerebral blood flow, cerebral blood volume, mean transit time and time-to-maximum. Automated exposure control, iterative reconstruction, and/or weight based adjustment of the mA/kV was utilized to reduce the radiation dose to as low as reasonably achievable. COMPARISON: None available CLINICAL HISTORY: Neuro deficit, acute, stroke suspected; Right sided weakness and numbness. FINDINGS: CTA NECK: AORTIC ARCH AND ARCH VESSELS: No dissection or arterial injury. No significant stenosis of the brachiocephalic or subclavian arteries. CERVICAL CAROTID ARTERIES: No dissection, arterial injury, or hemodynamically significant stenosis by NASCET criteria. CERVICAL VERTEBRAL ARTERIES: No  dissection, arterial injury, or significant stenosis. LUNGS AND MEDIASTINUM: Unremarkable. SOFT TISSUES: No acute abnormality. BONES: No acute abnormality. CTA HEAD: ANTERIOR CIRCULATION: No significant stenosis of the internal carotid arteries. No significant stenosis of the anterior cerebral arteries. No significant stenosis of the middle cerebral arteries. No aneurysm. POSTERIOR CIRCULATION: No significant stenosis of the posterior cerebral arteries. No significant stenosis of the basilar artery. No significant stenosis of the vertebral arteries. No aneurysm. OTHER: No dural venous sinus thrombosis on this non-dedicated study. CT PERFUSION: EXAM QUALITY: Exam quality is adequate with diagnostic perfusion maps. No significant motion artifact. Appropriate arterial inflow and venous outflow curves. CORE INFARCT (CBF<30% volume): 0 mL TOTAL HYPOPERFUSION (Tmax>6s volume): 4 mL PENUMBRA: Mismatch volume: 4 mL Mismatch ratio: Not applicable Location: Inferior left frontal lobe IMPRESSION: 1. Findings suggesting 4mL acute ischemic penumbra in the inferior left frontal lobe. Correlation with MRI may be helpful for confirmation. 2. No acute large vessel occlusion. 3. No hemodynamically significant stenosis or aneurysm in the head or  neck vessels. Electronically signed by: Franky Stanford MD 07/14/2024 09:06 PM EDT RP Workstation: [id number]   CT HEAD CODE STROKE WO CONTRAST Result Date: 07/14/2024 EXAM: CT HEAD WITHOUT CONTRAST 07/14/2024 08:15:43 PM TECHNIQUE: CT of the head was performed without the administration of intravenous contrast. Automated exposure control, iterative reconstruction, and/or weight based adjustment of the mA/kV was utilized to reduce the radiation dose to as low as reasonably achievable. COMPARISON: None available. CLINICAL HISTORY: Neuro deficit, acute, stroke suspected. Pt c/o R side of face, chest, R arm numbness onset approx 1hr ago. Like icy hot or novocaine on your body. Denies HA, blurred  vision, NV FINDINGS: BRAIN AND VENTRICLES: No acute hemorrhage. No evidence of acute infarct. No hydrocephalus. No extra-axial collection. No mass effect or midline shift. ASPECTS is 10. ORBITS: No acute abnormality. SINUSES: No acute abnormality. SOFT TISSUES AND SKULL: No acute soft tissue abnormality. No skull fracture. IMPRESSION: 1. No acute intracranial abnormality. Findings discussed with Dr. Lenor at 8:26 pm on 07/14/2024 . Electronically signed by: Franky Stanford MD 07/14/2024 08:27 PM EDT RP Workstation: [id number]    Assessment and Plan: 10F h/o stage 0 cervical cancer s/p hysterectomy in 2017, and diabetes p/w acute stroke.  CVA -Neuro following; appreciate eval/recs -PT/OT/SLP following; appreciate recs -Cardiac telemetry; consider 30d holter monitor at d/c if no arrhythmias captures -Allow permissive HTN (220/120 due to acute stroke) -Risk factor modification -Frequent neuro checks per protocol -F/u HgbA1c, fasting lipid panel -F/u TTE  DM2 -SSI TID AC prn while admitted -Carb controlled diet -F/u A1c   Advance Care Planning:   Code Status: Full Code   Consults: Neurology  Family Communication: Spouse  Severity of Illness: The appropriate patient status for this patient is INPATIENT. Inpatient status is judged to be reasonable and necessary in order to provide the required intensity of service to ensure the patient's safety. The patient's presenting symptoms, physical exam findings, and initial radiographic and laboratory data in the context of their chronic comorbidities is felt to place them at high risk for further clinical deterioration. Furthermore, it is not anticipated that the patient will be medically stable for discharge from the hospital within 2 midnights of admission.   * I certify that at the point of admission it is my clinical judgment that the patient will require inpatient hospital care spanning beyond 2 midnights from the point of admission due to high  intensity of service, high risk for further deterioration and high frequency of surveillance required.*   ------- I spent 65 minutes reviewing previous notes, at the bedside counseling/discussing the treatment plan, and performing clinical documentation.  Author: Marsha Ada, MD 07/15/2024 2:45 PM  For on call review [email or web address].

## 2024-07-15 NOTE — ED Notes (Signed)
 I have just given report to Ashley, Charity fundraiser at [facility] 3rd floor.

## 2024-07-15 NOTE — Plan of Care (Signed)
   Problem: Education: Goal: Knowledge of General Education information will improve Description: Including pain rating scale, medication(s)/side effects and non-pharmacologic comfort measures Outcome: Progressing   Problem: Clinical Measurements: Goal: Ability to maintain clinical measurements within normal limits will improve Outcome: Progressing   Problem: Clinical Measurements: Goal: Diagnostic test results will improve Outcome: Progressing   Problem: Clinical Measurements: Goal: Cardiovascular complication will be avoided Outcome: Progressing

## 2024-07-16 ENCOUNTER — Observation Stay (HOSPITAL_BASED_OUTPATIENT_CLINIC_OR_DEPARTMENT_OTHER)

## 2024-07-16 DIAGNOSIS — I6389 Other cerebral infarction: Secondary | ICD-10-CM | POA: Diagnosis not present

## 2024-07-16 DIAGNOSIS — E1169 Type 2 diabetes mellitus with other specified complication: Secondary | ICD-10-CM

## 2024-07-16 DIAGNOSIS — I639 Cerebral infarction, unspecified: Secondary | ICD-10-CM | POA: Diagnosis not present

## 2024-07-16 LAB — ECHOCARDIOGRAM COMPLETE
Area-P 1/2: 2.8 cm2
Height: 65 in
S' Lateral: 2.3 cm
Weight: 3068.8 [oz_av]

## 2024-07-16 LAB — BASIC METABOLIC PANEL WITH GFR
Anion gap: 12 (ref 5–15)
BUN: 8 mg/dL (ref 6–20)
CO2: 23 mmol/L (ref 22–32)
Calcium: 9.4 mg/dL (ref 8.9–10.3)
Chloride: 101 mmol/L (ref 98–111)
Creatinine, Ser: 0.76 mg/dL (ref 0.44–1.00)
GFR, Estimated: >60 mL/min (ref >60)
Glucose, Bld: 108 mg/dL — ABNORMAL HIGH (ref 70–99)
Potassium: 3.9 mmol/L (ref 3.5–5.1)
Sodium: 136 mmol/L (ref 135–145)

## 2024-07-16 LAB — GLUCOSE, CAPILLARY
Glucose-Capillary: 128 mg/dL — ABNORMAL HIGH (ref 70–99)
Glucose-Capillary: 226 mg/dL — ABNORMAL HIGH (ref 70–99)

## 2024-07-16 LAB — LIPID PANEL
Cholesterol: 179 mg/dL (ref 0–200)
HDL: 40 mg/dL — ABNORMAL LOW (ref >40)
LDL Cholesterol: 102 mg/dL — ABNORMAL HIGH (ref 0–99)
Total CHOL/HDL Ratio: 4.5 ratio
Triglycerides: 186 mg/dL — ABNORMAL HIGH (ref <150)
VLDL: 37 mg/dL (ref 0–40)

## 2024-07-16 MED ORDER — ROSUVASTATIN CALCIUM 40 MG PO TABS: 40.0000 mg | ORAL_TABLET | Freq: Every day | ORAL | 3 refills | Status: DC

## 2024-07-16 MED ORDER — ASPIRIN 81 MG PO CHEW: 81.0000 mg | CHEWABLE_TABLET | Freq: Every day | ORAL | 12 refills | Status: DC

## 2024-07-16 MED ORDER — CLOPIDOGREL BISULFATE 75 MG PO TABS: 75.0000 mg | ORAL_TABLET | Freq: Every day | ORAL | 0 refills | Status: AC

## 2024-07-16 MED ORDER — ROSUVASTATIN CALCIUM 20 MG PO TABS
40.0000 mg | ORAL_TABLET | Freq: Every day | ORAL | Status: DC
  Administered 2024-07-16: 40 mg via ORAL
  Filled 2024-07-16: qty 2

## 2024-07-16 NOTE — Progress Notes (Signed)
 Patient ready for dischage to home; discharge instructions given and reviewed by David,RN; patient discharged out via wheelchair.

## 2024-07-16 NOTE — Evaluation (Signed)
 Physical Therapy Evaluation Patient Details Name: Olivia Wall MRN: [id number] DOB: 1976-10-04 Today's Date: 07/16/2024  History of Present Illness  48 y.o. female presents to [facility] from [facility] ED on 07/15/24 due to acute onset of R sided numbness. Pt with small 4 ml perfusion deficit in the left anterior lobe which could be artifact vs acute ischemic. PMHx: DM, hyperlipedemia  Clinical Impression  PTA pt was independent for mobility with no AD. Pt presents at functional mobility baseline with UE/LE strength/sensation/coordination WFL. Pt was independent to ambulate 372ft and negotiate 5 steps with no handrails. Pt feels comfortable discharging home whenever medically stable with no further questions or concerns. Pt has no further acute or post-acute PT needs. Acute PT signing off. Please re-consult if there are changes in status.         If plan is discharge home, recommend the following: Assist for transportation   Can travel by private vehicle    Yes    Equipment Recommendations None recommended by PT     Functional Status Assessment Patient has not had a recent decline in their functional status     Precautions / Restrictions Precautions Precautions: None Restrictions Weight Bearing Restrictions Per Provider Order: No      Mobility  Bed Mobility Overal bed mobility: Independent   Transfers Overall transfer level: Independent Equipment used: None     Ambulation/Gait Ambulation/Gait assistance: Independent Gait Distance (Feet): 300 Feet Assistive device: None Gait Pattern/deviations: WFL(Within Functional Limits)       Stairs Stairs: Yes Stairs assistance: Independent Stair Management: No rails, Alternating pattern Number of Stairs: 5 General stair comments: alternating pattern with no UE support     Modified Rankin (Stroke Patients Only) Modified Rankin (Stroke Patients Only) Pre-Morbid Rankin Score: No symptoms Modified Rankin: No symptoms     Balance  Overall balance assessment: Independent       Pertinent Vitals/Pain Pain Assessment Pain Assessment: No/denies pain    Home Living Family/patient expects to be discharged to:: Private residence Living Arrangements: Spouse/significant other;Children (2 children) Available Help at Discharge: Family;Available PRN/intermittently Type of Home: House Home Access: Level entry     Alternate Level Stairs-Number of Steps: 12 Home Layout: Two level Home Equipment: None      Prior Function Prior Level of Function : Independent/Modified Independent;Driving;Working/employed   Mobility Comments: Ind with no AD, works as Consulting civil engineer at [employer] ADLs Comments: Ind     Extremity/Trunk Assessment   Upper Extremity Assessment Upper Extremity Assessment: Overall WFL for tasks assessed    Lower Extremity Assessment Lower Extremity Assessment: Overall WFL for tasks assessed    Cervical / Trunk Assessment Cervical / Trunk Assessment: Normal  Communication   Communication Communication: No apparent difficulties    Cognition Arousal: Alert Behavior During Therapy: WFL for tasks assessed/performed   PT - Cognitive impairments: No apparent impairments    Following commands: Intact       Cueing Cueing Techniques: Verbal cues      PT Assessment Patient does not need any further PT services         PT Goals (Current goals can be found in the Care Plan section)  Acute Rehab PT Goals PT Goal Formulation: All assessment and education complete, DC therapy     AM-PAC PT 6 Clicks Mobility  Outcome Measure Help needed turning from your back to your side while in a flat bed without using bedrails?: None Help needed moving from lying on your back to sitting on the side of a flat  bed without using bedrails?: None Help needed moving to and from a bed to a chair (including a wheelchair)?: None Help needed standing up from a chair using your arms (e.g., wheelchair or bedside chair)?: None Help  needed to walk in hospital room?: None Help needed climbing 3-5 steps with a railing? : None 6 Click Score: 24    End of Session   Activity Tolerance: Patient tolerated treatment well Patient left: in bed;with call bell/phone within reach;with family/visitor present Nurse Communication: Mobility status PT Visit Diagnosis: Other abnormalities of gait and mobility (R26.89)    Time: 0810-0822 PT Time Calculation (min) (ACUTE ONLY): 12 min   Charges:   PT Evaluation $PT Eval Low Complexity: 1 Low   PT General Charges $$ ACUTE PT VISIT: 1 Visit       Kate ORN, PT, DPT Secure Chat Preferred  Rehab Office [phone]   Kate BRAVO [name] 07/16/2024, 8:24 AM

## 2024-07-16 NOTE — Progress Notes (Signed)
 OT Cancellation Note  Patient Details Name: GAEL DELUDE MRN: [id number] DOB: 1976/03/16   Cancelled Treatment:    Reason Eval/Treat Not Completed: OT screened, no needs identified, will sign off (pt at baseline per PT with no acute OT needs at this time)  Lonney Revak K, OTD, OTR/L SecureChat Preferred Acute Rehab [phone]    Laneta MARLA Pereyra 07/16/2024, 9:11 AM

## 2024-07-16 NOTE — Plan of Care (Signed)

## 2024-07-16 NOTE — Progress Notes (Signed)
  Echocardiogram 2D Echocardiogram has been performed.  Olivia Wall 07/16/2024, 2:39 PM

## 2024-07-16 NOTE — Progress Notes (Signed)
 STROKE TEAM PROGRESS NOTE   INTERIM HISTORY/SUBJECTIVE Patient seen and evaluated this morning. Family at bedside. Symptoms resolved fully. Denies any numbness or weakness over the left side of her body. No headaches, no changes in vision, and no other complaints. Pending Echo    OBJECTIVE  CBC    Component Value Date/Time   WBC 12.1 (H) 07/14/2024 2009   RBC 3.89 07/14/2024 2009   HGB 13.1 07/14/2024 2009   HGB 12.5 12/28/2023 0934   HCT 38.8 07/14/2024 2009   HCT 38.1 12/28/2023 0934   PLT 316 07/14/2024 2009   PLT 353 12/28/2023 0934   MCV 99.7 07/14/2024 2009   MCV 100 (H) 12/28/2023 0934   MCV 96 03/15/2013 1541   MCH 33.7 07/14/2024 2009   MCHC 33.8 07/14/2024 2009   RDW 11.5 07/14/2024 2009   RDW 11.3 (L) 12/28/2023 0934   RDW 12.3 03/15/2013 1541   LYMPHSABS 2.7 07/14/2024 2009   LYMPHSABS 2.6 07/02/2020 0910   MONOABS 0.8 07/14/2024 2009   EOSABS 0.1 07/14/2024 2009   EOSABS 0.1 07/02/2020 0910   BASOSABS 0.0 07/14/2024 2009   BASOSABS 0.0 07/02/2020 0910    BMET    Component Value Date/Time   NA 136 07/14/2024 2009   NA 138 06/22/2024 1229   NA 134 (L) 03/15/2013 1541   K 4.0 07/14/2024 2009   K 4.1 03/15/2013 1541   CL 101 07/14/2024 2009   CL 104 03/15/2013 1541   CO2 23 07/14/2024 2009   CO2 23 03/15/2013 1541   GLUCOSE 178 (H) 07/14/2024 2009   GLUCOSE 137 (H) 03/15/2013 1541   BUN 9 07/14/2024 2009   BUN 10 06/22/2024 1229   BUN 11 03/15/2013 1541   CREATININE 0.78 07/14/2024 2009   CREATININE 0.65 03/15/2013 1541   CALCIUM  9.6 07/14/2024 2009   CALCIUM  9.0 03/15/2013 1541   EGFR 109 06/22/2024 1229   GFRNONAA >60 07/14/2024 2009   GFRNONAA >60 03/15/2013 1541    IMAGING past 24 hours MR BRAIN WO CONTRAST Result Date: 07/15/2024 EXAM: MRI BRAIN WITHOUT CONTRAST 07/15/2024 04:46:56 PM TECHNIQUE: Multiplanar multisequence MRI of the head/brain was performed without the administration of intravenous contrast. COMPARISON: 07/14/24. CLINICAL  HISTORY: Neuro deficit, acute, stroke suspected. FINDINGS: BRAIN AND VENTRICLES: No abnormal diffusion restriction that would correlate to the findings on the perfusion scan from 07/14/2024. No acute infarct. No intracranial hemorrhage. No mass. No midline shift. No hydrocephalus. The sella is unremarkable. Normal flow voids. ORBITS: No acute abnormality. SINUSES AND MASTOIDS: No acute abnormality. BONES AND SOFT TISSUES: Normal marrow signal. No acute soft tissue abnormality. IMPRESSION: 1. No acute intracranial abnormality. 2. No abnormal diffusion restriction to correlate with the findings on the perfusion scan from 07/14/24. Electronically signed by: Franky Stanford MD 07/15/2024 07:25 PM EDT RP Workstation: [id number]    Vitals:   07/15/24 1435 07/15/24 1945 07/15/24 2313 07/16/24 0335  BP: 110/64 99/78 110/76 (!) 96/59  Pulse: 61 76 80 63  Resp: 19  16   Temp: 98 F (36.7 C) 98 F (36.7 C) 98.2 F (36.8 C) 98 F (36.7 C)  TempSrc: Oral Oral  Oral  SpO2:  97% 97% 97%  Weight: 87 kg     Height: 5' 5 (1.651 m)        PHYSICAL EXAM General:  Alert, well-nourished, well-developed patient in no acute distress Psych:  Mood and affect appropriate for situation CV: Regular rate and rhythm on monitor Respiratory:  Regular, unlabored respirations on room air GI: Abdomen  soft and nontender   NEURO:  Mental Status: AA&Ox3, patient is able to give clear and coherent history Speech/Language: speech is without dysarthria or aphasia.  Naming, repetition, fluency, and comprehension intact.  Cranial Nerves:  II: PERRL. Visual fields full.  III, IV, VI: EOMI. Eyelids elevate symmetrically.  V: Sensation is intact to light touch and symmetrical to face.  VII: Face is symmetrical resting and smiling VIII: hearing intact to voice. IX, X: Palate elevates symmetrically. Phonation is normal.  KP:Dynloizm shrug 5/5. XII: tongue is midline without fasciculations. Motor: 5/5 strength to all muscle  groups tested.  Tone: is normal and bulk is normal Sensation- Intact to light touch bilaterally. Extinction absent to light touch to DSS.   Coordination: FTN intact bilaterally, HKS: no ataxia in BLE.No drift.  Gait- deferred  Most Recent NIH 0     ASSESSMENT/PLAN  Ms. Olivia Wall is a 48 y.o. female with history of Hyperlipidemia, diabetes admitted for numbness on the right side of her face and right arm.  NIH on Admission 1 for decrease sensation to right side  TIA:   Code Stroke CT head No acute abnormality CTA head & neck No LVO  CT perfusion  Findings suggesting 4mL acute ischemic penumbra in the inferior left frontal lobe. MRI  No acute abnormality  LDL 113, will add Crestor  40 HgbA1c 5.6 VTE prophylaxis - SCDs No antithrombotic prior to admission, now on aspirin  81 mg daily and clopidogrel  75 mg daily for 3 weeks and then Aspirin  alone. Therapy recommendations:  No follow up needed  Disposition:  Home after work up complete  Hyperlipidemia LDL 113, goal < 70 Add crestor  40mg   Continue statin at discharge  Other Stroke Risk Factors ETOH use, alcohol level <15, advised to drink no more than 1 drink(s) a day Obesity, Body mass index is 31.92 kg/m., BMI >/= 30 associated with increased stroke risk, recommend weight loss, diet and exercise as appropriate  Family hx stroke (uncles)   Hospital day # 0  Patient seen and examined by NP/APP with MD. MD to update note as needed.   Jorene Last, DNP, FNP-BC [facility] Pager: [phone]   ATTENDING NOTE: I reviewed above note and agree with the assessment and plan. Pt was seen and examined.   Seen and evaluated with NP. Back to baseline. No deficit. MRI negative for acute stroke. Diagnosis TIA, will recommend DAPT, Aspirin  and Plavix  for a total of 21 days then aspirin  alone. Will also recommend Crestor  40 mg nightly, LDL 113, goal less than 70. Lifestyle changes. Will sign off, please call us  for any  additional questions or concerns.   For detailed assessment and plan, please refer to above as I have made changes wherever appropriate. I spent a total of 30 minutes dedicated to the care of this patient.    Pastor Falling, MD  Stroke Neurology 07/16/2024 11:55 AM     To contact Stroke Continuity provider, please refer to [email or web address]. After hours, contact General Neurology

## 2024-07-17 NOTE — Discharge Summary (Signed)
 Physician Discharge Summary   Patient: Olivia Wall MRN: [id number] DOB: [birth year]  Admit date:     07/14/2024  Discharge date: 07/16/2024  Discharge Physician: Elgie Butter   PCP: Georgina Speaks, FNP   Recommendations at discharge:  Please follow up with neurology as scheduled Please follow up with PCP in one week.   Discharge Diagnoses: Principal Problem:   Acute ischemic stroke [facility])   Hospital Course: Olivia Wall is a 48 y.o. female with medical history significant of stage 0 cervical cancer s/p hysterectomy in 2017, and diabetes p/w acute stroke.   Assessment and Plan:  CVA She presented with numbness in the right side of the face and right arm. Symptoms have completed resolved now. MRI is negative.  CTA shows Findings suggesting 4mL acute ischemic penumbra in the inferior left frontal lobe.  LDL is 113, on crestor  40 mg  A1c is 5.6% Discharge on aspirin  81 mg daily and clopidogrel  75 mg daily for 3 weeks and then Aspirin  alone.  Echo unremarkable.    DM2 A1c is 5.6% Diet controlled.    Body mass index is 31.92 kg/m. Obesity    Consultants: neurology. Procedures performed: echo  Disposition: Home Diet recommendation:  Discharge Diet Orders (From admission, onward)     Start     Ordered   07/16/24 0000  Diet - low sodium heart healthy        07/16/24 1547           Carb modified diet DISCHARGE MEDICATION: Allergies as of 07/16/2024   No Known Allergies      Medication List     STOP taking these medications    ADVIL DUAL ACTION PO       TAKE these medications    aspirin  81 MG chewable tablet Chew 1 tablet (81 mg total) by mouth daily.   clopidogrel  75 MG tablet Commonly known as: PLAVIX  Take 1 tablet (75 mg total) by mouth daily for 21 days.   ofloxacin 0.3 % OTIC solution Commonly known as: FLOXIN Place 10 drops into both ears daily.   Ozempic  (2 MG/DOSE) 8 MG/3ML Sopn Generic drug: Semaglutide  (2 MG/DOSE) Inject 2 mg  into the skin once a week. What changed: additional instructions   rosuvastatin  40 MG tablet Commonly known as: CRESTOR  Take 1 tablet (40 mg total) by mouth daily. What changed:  medication strength how much to take   Vitamin D  (Ergocalciferol ) 1.25 MG (50000 UNIT) Caps capsule Commonly known as: DRISDOL  Take 1 capsule (50,000 Units total) by mouth 2 (two) times a week. What changed: additional instructions        Discharge Exam: Filed Weights   07/15/24 1435  Weight: 87 kg   General exam: Appears calm and comfortable  Respiratory system: Clear to auscultation. Respiratory effort normal. Cardiovascular system: S1 & S2 heard, RRR. No JVD, murmurs,  Gastrointestinal system: Abdomen is nondistended, soft and nontender.  Central nervous system: Alert and oriented.  Extremities: Symmetric 5 x 5 power. Skin: No rashes,  Psychiatry: Mood & affect appropriate.    Condition at discharge: fair  The results of significant diagnostics from this hospitalization (including imaging, microbiology, ancillary and laboratory) are listed below for reference.   Imaging Studies: ECHOCARDIOGRAM COMPLETE Result Date: 07/16/2024    ECHOCARDIOGRAM REPORT   Patient Name:   Olivia Wall Date of Exam: 07/16/2024 Medical Rec #:  [id number]       Height:       65.0 in Accession #:  [id number]      Weight:       191.8 lb Date of Birth:  1976-02-02       BSA:          1.943 m Patient Age:    48 years        BP:           115/55 mmHg Patient Gender: F               HR:           68 bpm. Exam Location:  Inpatient Procedure: 2D Echo (Both Spectral and Color Flow Doppler were utilized during            procedure). Indications:    stroke  History:        Patient has no prior history of Echocardiogram examinations.                 Risk Factors:Diabetes and Dyslipidemia.  Sonographer:    Tinnie Barefoot RDCS Referring Phys: [id number] MARSHA ADA IMPRESSIONS  1. Left ventricular ejection fraction, by estimation,  is 60 to 65%. The left ventricle has normal function. The left ventricle has no regional wall motion abnormalities. Left ventricular diastolic parameters were normal.  2. Right ventricular systolic function is normal. The right ventricular size is normal. There is normal pulmonary artery systolic pressure. The estimated right ventricular systolic pressure is 20.3 mmHg.  3. The mitral valve is normal in structure. No evidence of mitral valve regurgitation. No evidence of mitral stenosis.  4. The aortic valve is tricuspid. Aortic valve regurgitation is not visualized. No aortic stenosis is present.  5. The inferior vena cava is normal in size with greater than 50% respiratory variability, suggesting right atrial pressure of 3 mmHg. FINDINGS  Left Ventricle: Left ventricular ejection fraction, by estimation, is 60 to 65%. The left ventricle has normal function. The left ventricle has no regional wall motion abnormalities. The left ventricular internal cavity size was normal in size. There is  no left ventricular hypertrophy. Left ventricular diastolic parameters were normal. Right Ventricle: The right ventricular size is normal. No increase in right ventricular wall thickness. Right ventricular systolic function is normal. There is normal pulmonary artery systolic pressure. The tricuspid regurgitant velocity is 2.08 m/s, and  with an assumed right atrial pressure of 3 mmHg, the estimated right ventricular systolic pressure is 20.3 mmHg. Left Atrium: Left atrial size was normal in size. Right Atrium: Right atrial size was normal in size. Pericardium: There is no evidence of pericardial effusion. Mitral Valve: The mitral valve is normal in structure. No evidence of mitral valve regurgitation. No evidence of mitral valve stenosis. Tricuspid Valve: The tricuspid valve is normal in structure. Tricuspid valve regurgitation is trivial. No evidence of tricuspid stenosis. Aortic Valve: The aortic valve is tricuspid. Aortic  valve regurgitation is not visualized. No aortic stenosis is present. Pulmonic Valve: The pulmonic valve was normal in structure. Pulmonic valve regurgitation is not visualized. No evidence of pulmonic stenosis. Aorta: The aortic root and ascending aorta are structurally normal, with no evidence of dilitation. Venous: The inferior vena cava is normal in size with greater than 50% respiratory variability, suggesting right atrial pressure of 3 mmHg. IAS/Shunts: No atrial level shunt detected by color flow Doppler.  LEFT VENTRICLE PLAX 2D LVIDd:         4.30 cm   Diastology LVIDs:         2.30 cm   LV e' medial:  7.83 cm/s LV PW:         1.00 cm   LV E/e' medial:  8.4 LV IVS:        0.90 cm   LV e' lateral:   14.00 cm/s LVOT diam:     1.70 cm   LV E/e' lateral: 4.7 LV SV:         47 LV SV Index:   24 LVOT Area:     2.27 cm  RIGHT VENTRICLE             IVC RV Basal diam:  2.30 cm     IVC diam: 1.50 cm RV S prime:     12.10 cm/s TAPSE (M-mode): 2.2 cm LEFT ATRIUM             Index        RIGHT ATRIUM           Index LA diam:        2.90 cm 1.49 cm/m   RA Area:     11.20 cm LA Vol (A2C):   32.5 ml 16.72 ml/m  RA Volume:   23.80 ml  12.25 ml/m LA Vol (A4C):   36.6 ml 18.83 ml/m LA Biplane Vol: 36.4 ml 18.73 ml/m  AORTIC VALVE LVOT Vmax:   109.00 cm/s LVOT Vmean:  71.900 cm/s LVOT VTI:    0.206 m  AORTA Ao Root diam: 3.00 cm Ao Asc diam:  3.00 cm MITRAL VALVE               TRICUSPID VALVE MV Area (PHT): 2.80 cm    TR Peak grad:   17.3 mmHg MV Decel Time: 271 msec    TR Vmax:        208.00 cm/s MV E velocity: 65.60 cm/s MV A velocity: 46.00 cm/s  SHUNTS MV E/A ratio:  1.43        Systemic VTI:  0.21 m                            Systemic Diam: 1.70 cm Jerel Croitoru MD Electronically signed by Jerel Balding MD Signature Date/Time: 07/16/2024/2:48:59 PM    Final    MR BRAIN WO CONTRAST Result Date: 07/15/2024 EXAM: MRI BRAIN WITHOUT CONTRAST 07/15/2024 04:46:56 PM TECHNIQUE: Multiplanar multisequence MRI of the  head/brain was performed without the administration of intravenous contrast. COMPARISON: 07/14/24. CLINICAL HISTORY: Neuro deficit, acute, stroke suspected. FINDINGS: BRAIN AND VENTRICLES: No abnormal diffusion restriction that would correlate to the findings on the perfusion scan from 07/14/2024. No acute infarct. No intracranial hemorrhage. No mass. No midline shift. No hydrocephalus. The sella is unremarkable. Normal flow voids. ORBITS: No acute abnormality. SINUSES AND MASTOIDS: No acute abnormality. BONES AND SOFT TISSUES: Normal marrow signal. No acute soft tissue abnormality. IMPRESSION: 1. No acute intracranial abnormality. 2. No abnormal diffusion restriction to correlate with the findings on the perfusion scan from 07/14/24. Electronically signed by: Franky Stanford MD 07/15/2024 07:25 PM EDT RP Workstation: [id number]   CT ANGIO HEAD NECK W WO CM W PERF (CODE STROKE) Result Date: 07/14/2024 EXAM: CTA Head and Neck with Perfusion 07/14/2024 08:51:42 PM TECHNIQUE: CTA of the head and neck was performed with the administration of intravenous contrast. 3D postprocessing with multiplanar reconstructions and MIPs was performed to evaluate the vascular anatomy. Cerebral perfusion analysis using computed tomography with contrast administration, including post-processing of parametric maps with determination of cerebral blood flow, cerebral blood volume, mean transit time  and time-to-maximum. Automated exposure control, iterative reconstruction, and/or weight based adjustment of the mA/kV was utilized to reduce the radiation dose to as low as reasonably achievable. COMPARISON: None available CLINICAL HISTORY: Neuro deficit, acute, stroke suspected; Right sided weakness and numbness. FINDINGS: CTA NECK: AORTIC ARCH AND ARCH VESSELS: No dissection or arterial injury. No significant stenosis of the brachiocephalic or subclavian arteries. CERVICAL CAROTID ARTERIES: No dissection, arterial injury, or hemodynamically  significant stenosis by NASCET criteria. CERVICAL VERTEBRAL ARTERIES: No dissection, arterial injury, or significant stenosis. LUNGS AND MEDIASTINUM: Unremarkable. SOFT TISSUES: No acute abnormality. BONES: No acute abnormality. CTA HEAD: ANTERIOR CIRCULATION: No significant stenosis of the internal carotid arteries. No significant stenosis of the anterior cerebral arteries. No significant stenosis of the middle cerebral arteries. No aneurysm. POSTERIOR CIRCULATION: No significant stenosis of the posterior cerebral arteries. No significant stenosis of the basilar artery. No significant stenosis of the vertebral arteries. No aneurysm. OTHER: No dural venous sinus thrombosis on this non-dedicated study. CT PERFUSION: EXAM QUALITY: Exam quality is adequate with diagnostic perfusion maps. No significant motion artifact. Appropriate arterial inflow and venous outflow curves. CORE INFARCT (CBF<30% volume): 0 mL TOTAL HYPOPERFUSION (Tmax>6s volume): 4 mL PENUMBRA: Mismatch volume: 4 mL Mismatch ratio: Not applicable Location: Inferior left frontal lobe IMPRESSION: 1. Findings suggesting 4mL acute ischemic penumbra in the inferior left frontal lobe. Correlation with MRI may be helpful for confirmation. 2. No acute large vessel occlusion. 3. No hemodynamically significant stenosis or aneurysm in the head or neck vessels. Electronically signed by: Franky Stanford MD 07/14/2024 09:06 PM EDT RP Workstation: [id number]   CT HEAD CODE STROKE WO CONTRAST Result Date: 07/14/2024 EXAM: CT HEAD WITHOUT CONTRAST 07/14/2024 08:15:43 PM TECHNIQUE: CT of the head was performed without the administration of intravenous contrast. Automated exposure control, iterative reconstruction, and/or weight based adjustment of the mA/kV was utilized to reduce the radiation dose to as low as reasonably achievable. COMPARISON: None available. CLINICAL HISTORY: Neuro deficit, acute, stroke suspected. Pt c/o R side of face, chest, R arm numbness onset  approx 1hr ago. Like icy hot or novocaine on your body. Denies HA, blurred vision, NV FINDINGS: BRAIN AND VENTRICLES: No acute hemorrhage. No evidence of acute infarct. No hydrocephalus. No extra-axial collection. No mass effect or midline shift. ASPECTS is 10. ORBITS: No acute abnormality. SINUSES: No acute abnormality. SOFT TISSUES AND SKULL: No acute soft tissue abnormality. No skull fracture. IMPRESSION: 1. No acute intracranial abnormality. Findings discussed with Dr. Lenor at 8:26 pm on 07/14/2024 . Electronically signed by: Franky Stanford MD 07/14/2024 08:27 PM EDT RP Workstation: [id number]    Microbiology: Results for orders placed or performed during the hospital encounter of 12/01/20  Urine culture     Status: Abnormal   Collection Time: 12/01/20 11:01 AM   Specimen: Urine, Random  Result Value Ref Range Status   Specimen Description URINE, RANDOM  Final   Special Requests   Final    NONE Performed at [facility], [address], [place], [place] [address]    Culture >=100,000 COLONIES/mL ESCHERICHIA COLI (A)  Final   Report Status 12/03/2020 FINAL  Final   Organism ID, Bacteria ESCHERICHIA COLI (A)  Final      Susceptibility   Escherichia coli - MIC*    AMPICILLIN 4 SENSITIVE Sensitive     CEFAZOLIN  <=4 SENSITIVE Sensitive     CEFEPIME <=0.12 SENSITIVE Sensitive     CEFTRIAXONE  <=0.25 SENSITIVE Sensitive     CIPROFLOXACIN  <=0.25 SENSITIVE Sensitive  GENTAMICIN <=1 SENSITIVE Sensitive     IMIPENEM <=0.25 SENSITIVE Sensitive     NITROFURANTOIN <=16 SENSITIVE Sensitive     TRIMETH/SULFA <=20 SENSITIVE Sensitive     AMPICILLIN/SULBACTAM <=2 SENSITIVE Sensitive     PIP/TAZO <=4 SENSITIVE Sensitive     * >=100,000 COLONIES/mL ESCHERICHIA COLI    Labs: CBC: Recent Labs  Lab 07/14/24 2009  WBC 12.1*  NEUTROABS 8.4*  HGB 13.1  HCT 38.8  MCV 99.7  PLT 316   Basic Metabolic Panel: Recent Labs  Lab 07/14/24 2009 07/16/24 1112  NA 136 136  K 4.0 3.9  CL 101  101  CO2 23 23  GLUCOSE 178* 108*  BUN 9 8  CREATININE 0.78 0.76  CALCIUM  9.6 9.4   Liver Function Tests: Recent Labs  Lab 07/14/24 2009  AST 16  ALT 13  ALKPHOS 92  BILITOT 0.2  PROT 7.8  ALBUMIN 4.5   CBG: Recent Labs  Lab 07/15/24 1655 07/15/24 2102 07/16/24 0614 07/16/24 1145  GLUCAP 100* 189* 128* 226*    Discharge time spent: 39 minutes.   Signed: Elgie Butter, MD [facility] 07/17/2024

## 2024-07-20 ENCOUNTER — Ambulatory Visit: Admitting: Nurse Practitioner

## 2024-07-20 ENCOUNTER — Encounter: Payer: Self-pay | Admitting: Nurse Practitioner

## 2024-07-20 VITALS — BP 120/80 | HR 88 | Temp 98.3°F | Ht 65.0 in | Wt 189.4 lb

## 2024-07-20 DIAGNOSIS — E1169 Type 2 diabetes mellitus with other specified complication: Secondary | ICD-10-CM | POA: Diagnosis not present

## 2024-07-20 DIAGNOSIS — I639 Cerebral infarction, unspecified: Secondary | ICD-10-CM

## 2024-07-20 DIAGNOSIS — Z6831 Body mass index (BMI) 31.0-31.9, adult: Secondary | ICD-10-CM

## 2024-07-20 DIAGNOSIS — E785 Hyperlipidemia, unspecified: Secondary | ICD-10-CM | POA: Diagnosis not present

## 2024-07-20 DIAGNOSIS — Z09 Encounter for follow-up examination after completed treatment for conditions other than malignant neoplasm: Secondary | ICD-10-CM

## 2024-07-20 DIAGNOSIS — E6609 Other obesity due to excess calories: Secondary | ICD-10-CM

## 2024-07-20 DIAGNOSIS — E559 Vitamin D deficiency, unspecified: Secondary | ICD-10-CM | POA: Diagnosis not present

## 2024-07-20 DIAGNOSIS — E66811 Obesity, class 1: Secondary | ICD-10-CM | POA: Diagnosis not present

## 2024-07-20 DIAGNOSIS — Z8673 Personal history of transient ischemic attack (TIA), and cerebral infarction without residual deficits: Secondary | ICD-10-CM

## 2024-07-20 NOTE — Progress Notes (Signed)
 LILLETTE Kristeen JINNY Gladis, CMA,acting as a Neurosurgeon for Gaines Ada, FNP.,have documented all relevant documentation on the behalf of Gaines Ada, FNP,as directed by  Gaines Ada, FNP while in the presence of Gaines Ada, FNP.  Subjective:  Patient ID: Olivia Wall , female    DOB: 13-Aug-1976 , 48 y.o.   MRN: [id number]  Chief Complaint  Patient presents with   Hospitalization Follow-up    Patient presents today for a hospital follow up- patient was diagnosed with Acute ischemic stroke. Patient was admitted on 07/14/2024 and discharged on 07/16/2024. Patient reports today she is feeling better but has a lot of fatigue.       HPI  Discussed the use of AI scribe software for clinical note transcription with the patient, who gave verbal consent to proceed.  History of Present Illness Olivia Wall is a 48 year old female with diabetes who presents with symptoms of an acute ischemic stroke.  She experienced a sudden onset of right facial and right arm numbness, along with tingling sensations described as similar to 'Novocaine' or 'icy hot'. These symptoms began while she was in [place], prompting her to seek medical attention at the ER rather than urgent care.  She was admitted to the hospital on July 14, 2024, and discharged on July 16, 2024, after being diagnosed with an acute ischemic stroke.  Her past medical history includes diabetes and a hysterectomy in 2017 due to stage zero cervical cancer. Her A1c is well-controlled at 5.6%. She has no history of high blood pressure, and her recent blood work was unremarkable, except for slightly elevated triglycerides and LDL levels.  Current medications include Crestor , which was increased from 5 mg to 40 mg, aspirin  81 mg daily, and clopidogrel  75 mg daily. She also receives a three-month supply of rosuvastatin , Ozempic , and vitamin D  through [facility].  She feels tired and slower than usual but denies any significant residual weakness or  sensory deficits in her legs. 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   Past Medical History:  Diagnosis Date   Abnormal Pap smear of cervix    Cancer (HCC)    Chicken pox    Diabetes mellitus without complication (HCC)    Ovarian cyst      Family History  Problem Relation Age of Onset   Cancer Mother    Multiple sclerosis Mother    Breast cancer Paternal Aunt        <50   Diabetes Maternal Grandmother    Cancer Paternal Grandmother    COPD Paternal Grandfather    Breast cancer Cousin        first cousin <50     Current Outpatient Medications:    aspirin  81 MG chewable tablet, Chew 1 tablet (81 mg total) by mouth daily., Disp: 30 tablet, Rfl: 12   clopidogrel  (PLAVIX ) 75 MG tablet, Take 1 tablet (75 mg total) by mouth daily for 21 days., Disp: 21 tablet, Rfl: 0   ofloxacin (FLOXIN) 0.3 % OTIC solution, Place 10 drops into both ears daily., Disp: , Rfl:    rosuvastatin  (CRESTOR ) 40 MG tablet, Take 1 tablet (40 mg total) by mouth daily., Disp: 30 tablet, Rfl: 3   Semaglutide , 2 MG/DOSE, (OZEMPIC , 2 MG/DOSE,) 8 MG/3ML SOPN, Inject 2 mg into the skin once a week. (Patient taking differently: Inject 2 mg into the skin once a week. On Saturday),  Disp: 9 mL, Rfl: 1   Vitamin D , Ergocalciferol , (DRISDOL ) 1.25 MG (50000 UNIT) CAPS capsule, TAKE 1 CAPSULE BY MOUTH TWICE  WEEKLY, Disp: 26 capsule, Rfl: 3   No Known Allergies   Review of Systems  Constitutional: Negative.   Respiratory: Negative.    Cardiovascular: Negative.   Musculoskeletal: Negative.   Skin: Negative.   Neurological: Negative.   Psychiatric/Behavioral: Negative.       Today's Vitals   07/20/24 1619  BP: 120/80  Pulse: 88  Temp: 98.3 F (36.8 C)  TempSrc: Oral  Weight: 189 lb 6.4 oz (85.9 kg)  Height: 5' 5 (1.651 m)  PainSc: 0-No pain   Body mass index is 31.52 kg/m.  Wt Readings from Last  3 Encounters:  07/20/24 189 lb 6.4 oz (85.9 kg)  07/15/24 191 lb 12.8 oz (87 kg)  06/22/24 189 lb 9.6 oz (86 kg)    The ASCVD Risk score (Arnett DK, et al., 2019) failed to calculate for the following reasons:   Risk score cannot be calculated because patient has a medical history suggesting prior/existing ASCVD  Objective:  Physical Exam Vitals and nursing note reviewed.  Constitutional:      General: She is not in acute distress.    Appearance: Normal appearance.  Cardiovascular:     Rate and Rhythm: Normal rate and regular rhythm.     Pulses: Normal pulses.     Heart sounds: Normal heart sounds. No murmur heard. Pulmonary:     Effort: Pulmonary effort is normal. No respiratory distress.     Breath sounds: Normal breath sounds. No wheezing.  Skin:    General: Skin is warm and dry.     Capillary Refill: Capillary refill takes less than 2 seconds.  Neurological:     General: No focal deficit present.     Mental Status: She is alert and oriented to person, place, and time.     Cranial Nerves: No cranial nerve deficit.     Motor: Weakness (right upper arm with slight weakness) present.     Coordination: Coordination is intact.     Gait: Gait is intact.     Comments: s  Psychiatric:        Mood and Affect: Mood normal.        Behavior: Behavior normal.        Thought Content: Thought content normal.        Judgment: Judgment normal.     Assessment And Plan:  Hospital discharge follow-up Assessment & Plan: She was admitted from 9/19-9/21 after having a stroke, she is feeling better and she is already back at work.    Class 1 obesity due to excess calories with body mass index (BMI) of 31.0 to 31.9 in adult, unspecified whether serious comorbidity present  History of ischemic stroke Assessment & Plan: Residual right-sided sensory deficit post-stroke, with decreased sensation in the right arm. Fatigue and slower movements noted. High risk of recurrent stroke within six  months. Full recovery expected within a year. - Continue aspirin  81 mg daily and clopidogrel  75 mg daily for three weeks, then switch to aspirin  alone. - Refer to neurology for follow-up care. - Encourage hydration and consider multivitamin. - Monitor for worsening symptoms or new symptoms, return to ER if she occurs. - Engage in activities to strengthen fine motor skills, such as typing and finger exercises.  Orders: -     Ambulatory referral to Neurology  Type 2 diabetes mellitus with hyperlipidemia [facility]) Assessment & Plan:  A1c well-controlled at 5.6%. - Continue current medication regimen including semaglutide . LDL slightly elevated at 113 mg/dL. Triglycerides slightly elevated. - Continue rosuvastatin , increased from 5 mg to 40 mg.   Vitamin D  deficiency Assessment & Plan: Managed with ergocalciferol . - Continue ergocalciferol  1.25 mg (50,000 Units) oral twice weekly.     Return for keep same next.  Patient was given opportunity to ask questions. Patient verbalized understanding of the plan and was able to repeat key elements of the plan. All questions were answered to their satisfaction.    LILLETTE Gaines Ada, FNP, have reviewed all documentation for this visit. The documentation on 07/20/24 for the exam, diagnosis, procedures, and orders are all accurate and complete.   IF YOU HAVE BEEN REFERRED TO A SPECIALIST, IT MAY TAKE 1-2 WEEKS TO SCHEDULE/PROCESS THE REFERRAL. IF YOU HAVE NOT HEARD FROM US /SPECIALIST IN TWO WEEKS, PLEASE GIVE US  A CALL AT [phone].

## 2024-07-31 ENCOUNTER — Other Ambulatory Visit: Payer: Self-pay | Admitting: Nurse Practitioner

## 2024-07-31 ENCOUNTER — Telehealth: Payer: Self-pay

## 2024-07-31 ENCOUNTER — Encounter: Payer: Self-pay | Admitting: Nurse Practitioner

## 2024-07-31 DIAGNOSIS — E559 Vitamin D deficiency, unspecified: Secondary | ICD-10-CM

## 2024-07-31 NOTE — Telephone Encounter (Signed)
 Copied from CRM [id number]. Topic: General - Other >> Jul 31, 2024 10:59 AM Carlatta H wrote: Reason for CRM: Patient was last seen on 9/25 and she needs to have some paperwork filled out regarding her health//Can she just drop off paperwork instead of having another appointment//Please call to advise//  SPOKE WITH GAVE HER THE OKAY TO BRING PAPER WORK -CM, CMA.

## 2024-08-02 ENCOUNTER — Ambulatory Visit: Admitting: Nurse Practitioner

## 2024-08-03 DIAGNOSIS — Z09 Encounter for follow-up examination after completed treatment for conditions other than malignant neoplasm: Secondary | ICD-10-CM | POA: Insufficient documentation

## 2024-08-03 DIAGNOSIS — E6609 Other obesity due to excess calories: Secondary | ICD-10-CM | POA: Insufficient documentation

## 2024-08-03 NOTE — Assessment & Plan Note (Signed)
 Managed with ergocalciferol . - Continue ergocalciferol  1.25 mg (50,000 Units) oral twice weekly.

## 2024-08-03 NOTE — Assessment & Plan Note (Signed)
 A1c well-controlled at 5.6%. - Continue current medication regimen including semaglutide . LDL slightly elevated at 113 mg/dL. Triglycerides slightly elevated. - Continue rosuvastatin , increased from 5 mg to 40 mg.

## 2024-08-03 NOTE — Assessment & Plan Note (Signed)
 She was admitted from 9/19-9/21 after having a stroke, she is feeling better and she is already back at work.

## 2024-08-03 NOTE — Assessment & Plan Note (Signed)
 Residual right-sided sensory deficit post-stroke, with decreased sensation in the right arm. Fatigue and slower movements noted. High risk of recurrent stroke within six months. Full recovery expected within a year. - Continue aspirin  81 mg daily and clopidogrel  75 mg daily for three weeks, then switch to aspirin  alone. - Refer to neurology for follow-up care. - Encourage hydration and consider multivitamin. - Monitor for worsening symptoms or new symptoms, return to ER if she occurs. - Engage in activities to strengthen fine motor skills, such as typing and finger exercises.

## 2024-08-10 ENCOUNTER — Ambulatory Visit: Admitting: Neurology

## 2024-08-10 ENCOUNTER — Encounter: Payer: Self-pay | Admitting: Neurology

## 2024-08-10 VITALS — BP 124/76 | HR 88 | Resp 16 | Ht 66.0 in | Wt 191.0 lb

## 2024-08-10 DIAGNOSIS — G459 Transient cerebral ischemic attack, unspecified: Secondary | ICD-10-CM

## 2024-08-10 DIAGNOSIS — Z789 Other specified health status: Secondary | ICD-10-CM

## 2024-08-10 NOTE — Progress Notes (Signed)
 [facility]  PATIENT: Olivia Wall DOB: [birth year]  REQUESTING CLINICIAN: Georgina Speaks, FNP HISTORY FROM: Patient/Husband  REASON FOR VISIT: TIA    HISTORICAL  CHIEF COMPLAINT:  Chief Complaint  Patient presents with   New Patient (Initial Visit)    RM12, HUSBAND PRESENT, Internal referral for acute ischemic stroke: PT REPORTED FATIGUES EASILY     HISTORY OF PRESENT ILLNESS:  Discussed the use of AI scribe software for clinical note transcription with the patient, who gave verbal consent to proceed.  Olivia Wall is a 48 year old female with a history of hypertension, hyperlipidemia, diabetes mellitus, obesity who is presenting after an admission to the hospital for right-sided weakness, negative MRI and diagnosed with TIA.    She experienced a transient ischemic attack recently, initially suspected to be a stroke based on a CT scan. An MRI was performed and did not show any abnormalities. She was initially treated with aspirin  and Plavix  for three weeks and is now on aspirin  alone.  She was on Crestor  5 mg, which was increased to 40 mg.  She feels tired and experiences mild right-sided weakness. Her fatigue is physical, with her body feeling exhausted by the evening, particularly after making dinner. She goes to bed earlier, around 9:30 PM, and sleeps through the night without waking up multiple times. She wakes up feeling okay but becomes fatigued again by the evening.  She is currently taking Crestor  at night and reports a new symptom of skin sensitivity, describing it as 'hurt like sunburn' when touched or when water hits her skin in the shower. This symptom started about a week ago and occurs intermittently. She has a history of low drug tolerance, which she experienced with Lipitor, causing nausea and inability to get out of bed, leading to a switch to Crestor .  She has been on Ozempic  for almost two years, resulting in a weight loss of about thirty  pounds and a reduction in her A1c from 8.7 to 5.6. She has plateaued in weight loss but maintains good control of her diabetes.  Her social history includes working from home, which involves frequent movement up and down stairs. She does not engage in regular exercise but used to walk trails. She drinks water instead of soda.   OTHER MEDICAL CONDITIONS: Hypertension, hyperlipidemia, diabetes mellitus, obesity   REVIEW OF SYSTEMS: Full 14 system review of systems performed and negative with exception of: As noted in HPI yes this is probably just  ALLERGIES: No Known Allergies  HOME MEDICATIONS: Outpatient Medications Prior to Visit  Medication Sig Dispense Refill   aspirin  81 MG chewable tablet Chew 1 tablet (81 mg total) by mouth daily. 30 tablet 12   rosuvastatin  (CRESTOR ) 40 MG tablet Take 1 tablet (40 mg total) by mouth daily. 30 tablet 3   Semaglutide , 2 MG/DOSE, (OZEMPIC , 2 MG/DOSE,) 8 MG/3ML SOPN Inject 2 mg into the skin once a week. (Patient taking differently: Inject 2 mg into the skin once a week. On Saturday) 9 mL 1   Vitamin D , Ergocalciferol , (DRISDOL ) 1.25 MG (50000 UNIT) CAPS capsule TAKE 1 CAPSULE BY MOUTH TWICE  WEEKLY 26 capsule 3   ofloxacin (FLOXIN) 0.3 % OTIC solution Place 10 drops into both ears daily.     No facility-administered medications prior to visit.    PAST MEDICAL HISTORY: Past Medical History:  Diagnosis Date   Abnormal Pap smear of cervix    Cancer (HCC)    Chicken pox  Diabetes mellitus without complication (HCC)    Ovarian cyst     PAST SURGICAL HISTORY: Past Surgical History:  Procedure Laterality Date   ABDOMINAL HYSTERECTOMY     CESAREAN SECTION  1994   COSMETIC SURGERY  1/21   tummy tuck   LAPAROSCOPIC BILATERAL SALPINGECTOMY Bilateral 05/07/2016   Procedure: LAPAROSCOPIC BILATERAL SALPINGECTOMY;  Surgeon: Mitzie BROCKS Ward, MD;  Location: [facility];  Service: Gynecology;  Laterality: Bilateral;   LAPAROSCOPIC HYSTERECTOMY N/A  05/07/2016   Procedure: HYSTERECTOMY TOTAL LAPAROSCOPIC;  Surgeon: Mitzie BROCKS Ward, MD;  Location: [facility];  Service: Gynecology;  Laterality: N/A;   LEEP N/A 03/13/2016   Procedure: LOOP ELECTROSURGICAL EXCISION PROCEDURE (LEEP);  Surgeon: Mitzie BROCKS Ward, MD;  Location: [facility];  Service: Gynecology;  Laterality: N/A;   SHOULDER SURGERY Right 2014    FAMILY HISTORY: Family History  Problem Relation Age of Onset   Cancer Mother    Multiple sclerosis Mother    Breast cancer Paternal Aunt        <50   Diabetes Maternal Grandmother    Cancer Paternal Grandmother    COPD Paternal Grandfather    Breast cancer Cousin        first cousin <50    SOCIAL HISTORY: Social History   Socioeconomic History   Marital status: Married    Spouse name: Not on file   Number of children: Not on file   Years of education: Not on file   Highest education level: Associate degree: occupational, Scientist, product/process development, or vocational program  Occupational History   Not on file  Tobacco Use   Smoking status: Former    Current packs/day: 0.00    Average packs/day: 0.5 packs/day for 15.0 years (7.5 ttl pk-yrs)    Types: Cigarettes    Start date: 03/05/1997    Quit date: 03/05/2012    Years since quitting: 12.4   Smokeless tobacco: Never  Vaping Use   Vaping status: Never Used  Substance and Sexual Activity   Alcohol use: No   Drug use: No   Sexual activity: Yes    Birth control/protection: Surgical    Comment: hysterectomy  Other Topics Concern   Not on file  Social History Narrative   Not on file   Social Drivers of Health   Financial Resource Strain: Low Risk  (06/22/2024)   Overall Financial Resource Strain (CARDIA)    Difficulty of Paying Living Expenses: Not very hard  Food Insecurity: No Food Insecurity (07/15/2024)   Hunger Vital Sign    Worried About Running Out of Food in the Last Year: Never true    Ran Out of Food in the Last Year: Never true  Transportation Needs: No Transportation Needs  (07/15/2024)   PRAPARE - Administrator, Civil Service (Medical): No    Lack of Transportation (Non-Medical): No  Physical Activity: Insufficiently Active (06/22/2024)   Exercise Vital Sign    Days of Exercise per Week: 1 day    Minutes of Exercise per Session: 20 min  Stress: No Stress Concern Present (06/22/2024)   [employer] of Occupational Health - Occupational Stress Questionnaire    Feeling of Stress: Not at all  Social Connections: Moderately Isolated (06/22/2024)   Social Connection and Isolation Panel    Frequency of Communication with Friends and Family: More than three times a week    Frequency of Social Gatherings with Friends and Family: Three times a week    Attends Religious Services: Never    Active Member  of Clubs or Organizations: No    Attends Banker Meetings: Not on file    Marital Status: Married  Intimate Partner Violence: Not At Risk (07/15/2024)   Humiliation, Afraid, Rape, and Kick questionnaire    Fear of Current or Ex-Partner: No    Emotionally Abused: No    Physically Abused: No    Sexually Abused: No    PHYSICAL EXAM  GENERAL EXAM/CONSTITUTIONAL: Vitals:  Vitals:   08/10/24 1556  BP: 124/76  Pulse: 88  Resp: 16  SpO2: 95%  Weight: 191 lb (86.6 kg)  Height: 5' 6 (1.676 m)   Body mass index is 30.83 kg/m. Wt Readings from Last 3 Encounters:  08/10/24 191 lb (86.6 kg)  07/20/24 189 lb 6.4 oz (85.9 kg)  07/15/24 191 lb 12.8 oz (87 kg)   Patient is in no distress; well developed, nourished and groomed; neck is supple  MUSCULOSKELETAL: Gait, strength, tone, movements noted in Neurologic exam below  NEUROLOGIC: MENTAL STATUS:      No data to display         awake, alert, oriented to person, place and time recent and remote memory intact normal attention and concentration language fluent, comprehension intact, naming intact fund of knowledge appropriate  CRANIAL NERVE:  2nd, 3rd, 4th, 6th - Visual  fields full to confrontation, extraocular muscles intact, no nystagmus 5th - facial sensation symmetric 7th - facial strength symmetric 8th - hearing intact 9th - palate elevates symmetrically, uvula midline 11th - shoulder shrug symmetric 12th - tongue protrusion midline  MOTOR:  normal bulk and tone, full strength in the BUE, BLE  SENSORY:  normal and symmetric to light touch  COORDINATION:  finger-nose-finger, fine finger movements normal  GAIT/STATION:  normal   DIAGNOSTIC DATA (LABS, IMAGING, TESTING) - I reviewed patient records, labs, notes, testing and imaging myself where available.  Lab Results  Component Value Date   WBC 12.1 (H) 07/14/2024   HGB 13.1 07/14/2024   HCT 38.8 07/14/2024   MCV 99.7 07/14/2024   PLT 316 07/14/2024      Component Value Date/Time   NA 136 07/16/2024 1112   NA 138 06/22/2024 1229   NA 134 (L) 03/15/2013 1541   K 3.9 07/16/2024 1112   K 4.1 03/15/2013 1541   CL 101 07/16/2024 1112   CL 104 03/15/2013 1541   CO2 23 07/16/2024 1112   CO2 23 03/15/2013 1541   GLUCOSE 108 (H) 07/16/2024 1112   GLUCOSE 137 (H) 03/15/2013 1541   BUN 8 07/16/2024 1112   BUN 10 06/22/2024 1229   BUN 11 03/15/2013 1541   CREATININE 0.76 07/16/2024 1112   CREATININE 0.65 03/15/2013 1541   CALCIUM  9.4 07/16/2024 1112   CALCIUM  9.0 03/15/2013 1541   PROT 7.8 07/14/2024 2009   PROT 7.2 06/22/2024 1229   ALBUMIN 4.5 07/14/2024 2009   ALBUMIN 4.2 06/22/2024 1229   AST 16 07/14/2024 2009   ALT 13 07/14/2024 2009   ALKPHOS 92 07/14/2024 2009   BILITOT 0.2 07/14/2024 2009   BILITOT 0.2 06/22/2024 1229   GFRNONAA >60 07/16/2024 1112   GFRNONAA >60 03/15/2013 1541   GFRAA 105 07/02/2020 0910   GFRAA >60 03/15/2013 1541   Lab Results  Component Value Date   CHOL 179 07/16/2024   HDL 40 (L) 07/16/2024   LDLCALC 102 (H) 07/16/2024   TRIG 186 (H) 07/16/2024   CHOLHDL 4.5 07/16/2024   Lab Results  Component Value Date   HGBA1C 5.6 07/15/2024  Lab Results  Component Value Date   VITAMINB12 1,650 (H) 06/30/2019   Lab Results  Component Value Date   TSH 1.120 12/28/2023    MRI Brain 07/15/2024 1. No acute intracranial abnormality. 2. No abnormal diffusion restriction to correlate with the findings on the perfusion scan from 07/14/24.  MRI Brain 07/14/2024 1. Findings suggesting 4mL acute ischemic penumbra in the inferior left frontal lobe. Correlation with MRI may be helpful for confirmation. 2. No acute large vessel occlusion. 3. No hemodynamically significant stenosis or aneurysm in the head or neck vessels   ASSESSMENT AND PLAN  48 y.o. year old female with vascular risk factor including hypertension, hyperlipidemia, obesity, presenting after being seen in the hospital for right-sided weakness and diagnosed with TIA.  Completed DAPT for 21 days and currently on aspirin  81 mg daily and Crestor  40 mg daily   Transient ischemic attack (TIA) The TIA was confirmed by a normal MRI, distinguishing it from a stroke. It is attributed to risk factors including hypertension, hyperlipidemia, and potentially stress. There is no evidence of vascular narrowing in the head and neck, and cardiac evaluation showed no abnormalities. - Continue aspirin  therapy - Maintain blood pressure control  Hypertension Hypertension is a contributing risk factor for TIA. Blood pressure management is crucial to reduce the risk of future cerebrovascular events. - Continue current antihypertensive regimen - Encourage lifestyle modifications including diet and exercise  Hyperlipidemia with statin intolerance Hyperlipidemia management is complicated by statin intolerance. Current LDL is 102, with a target of less than 70 due to TIA history. Previous intolerance to Lipitor and current issues with Crestor  suggest sensitivity to statins. - Hold Crestor  for two weeks - Order CK test to assess muscle damage - Consider alternative lipid-lowering therapy if  symptoms resolve, such as Repatha - Refer to lipid clinic if statin intolerance is confirmed  Type 2 diabetes mellitus, well controlled Type 2 diabetes is well controlled with an A1c of 5.6, reduced from previous levels. Current management with Ozempic  has resulted in significant weight loss and improved glycemic control. - Continue Ozempic  therapy    1. TIA (transient ischemic attack)   2. Statin intolerance      Patient Instructions  Continue with aspirin  81 mg daily Will obtain creatinine kinase today Hold statin Crestor  for the next 2 weeks and monitor symptoms. If patient is back to her baseline, patient likely has statin intolerance and we will refer her to lipid clinic Continue follow-up PCP Return as needed    Orders Placed This Encounter  Procedures   CK    No orders of the defined types were placed in this encounter.   Return if symptoms worsen or fail to improve.    Pastor Falling, MD 08/10/2024, 4:55 PM  [facility] [address] [place], [place] [address] [phone]

## 2024-08-10 NOTE — Patient Instructions (Addendum)
 Continue with aspirin  81 mg daily Will obtain creatinine kinase today Hold statin Crestor  for the next 2 weeks and monitor symptoms. If patient is back to her baseline, patient likely has statin intolerance and we will refer her to lipid clinic Continue follow-up PCP Return as needed

## 2024-08-11 ENCOUNTER — Ambulatory Visit: Payer: Self-pay | Admitting: Neurology

## 2024-08-11 LAB — CK: Total CK: 102 U/L (ref 32–182)

## 2024-08-24 ENCOUNTER — Other Ambulatory Visit: Payer: Self-pay | Admitting: Neurology

## 2024-08-24 ENCOUNTER — Encounter: Payer: Self-pay | Admitting: Neurology

## 2024-08-24 ENCOUNTER — Telehealth: Payer: Self-pay | Admitting: Neurology

## 2024-08-24 ENCOUNTER — Other Ambulatory Visit: Payer: Self-pay

## 2024-08-24 DIAGNOSIS — Z789 Other specified health status: Secondary | ICD-10-CM

## 2024-08-24 DIAGNOSIS — G459 Transient cerebral ischemic attack, unspecified: Secondary | ICD-10-CM

## 2024-08-24 NOTE — Telephone Encounter (Signed)
 Referral to [facility]   sent thru epic to [facility] at [facility] at [address] [phone]

## 2024-09-13 ENCOUNTER — Ambulatory Visit (HOSPITAL_BASED_OUTPATIENT_CLINIC_OR_DEPARTMENT_OTHER): Admitting: Internal Medicine

## 2024-09-13 ENCOUNTER — Encounter (HOSPITAL_BASED_OUTPATIENT_CLINIC_OR_DEPARTMENT_OTHER): Payer: Self-pay | Admitting: Internal Medicine

## 2024-09-13 VITALS — BP 124/62 | HR 67 | Ht 66.0 in | Wt 188.8 lb

## 2024-09-13 DIAGNOSIS — Z8673 Personal history of transient ischemic attack (TIA), and cerebral infarction without residual deficits: Secondary | ICD-10-CM

## 2024-09-13 DIAGNOSIS — Z79899 Other long term (current) drug therapy: Secondary | ICD-10-CM | POA: Diagnosis not present

## 2024-09-13 DIAGNOSIS — E785 Hyperlipidemia, unspecified: Secondary | ICD-10-CM | POA: Diagnosis not present

## 2024-09-13 DIAGNOSIS — E782 Mixed hyperlipidemia: Secondary | ICD-10-CM

## 2024-09-13 NOTE — Patient Instructions (Addendum)
 Medication Instructions:   Your physician recommends that you continue on your current medications as directed. Please refer to the Current Medication list given to you today.  See below some information about different lipid therapies  *If you need a refill on your cardiac medications before your next appointment, please call your pharmacy*  Lab Work: Your physician recommends that you return for lab work in:   TODAY   NMR Lipoprofile and Lp(a)  If you have labs (blood work) drawn today and your tests are completely normal, you will receive your results only by: MyChart Message (if you have MyChart) OR A paper copy in the mail If you have any lab test that is abnormal or we need to change your treatment, we will call you to review the results.    Follow-Up:  AS NEEDED WITH DR. HILTY  Evolocumab Injection What is this medication? EVOLOCUMAB (e voe LOK ue mab) treats high cholesterol. It may also be used to lower the risk of heart attack, stroke, worsening chest pain (unstable angina), and a type of heart surgery. It works by decreasing bad cholesterol (such as LDL) in your blood. It is a monoclonal antibody. Changes to diet and exercise are often combined with this medication. This medicine may be used for other purposes; ask your health care provider or pharmacist if you have questions. COMMON BRAND NAME(S): Repatha, Repatha SureClick What should I tell my care team before I take this medication? They need to know if you have any of these conditions: An unusual or allergic reaction to evolocumab, latex, other medications, foods, dyes, or preservatives Pregnant or trying to get pregnant Breast-feeding How should I use this medication? This medication is injected under the skin. You will be taught how to prepare and give it. Take it as directed on the prescription label at the same time every day. Keep taking it unless your care team tells you to stop. It is important that you  put your used needles and syringes in a special sharps container. Do not put them in a trash can. If you do not have a sharps container, call your pharmacist or care team to get one. This medication comes with INSTRUCTIONS FOR USE. Ask your pharmacist for directions on how to use this medication. Read the information carefully. Talk to your pharmacist or care team if you have questions. Talk to your care team about the use of this medication in children. While it may be prescribed for children as young as 10 years for selected conditions, precautions do apply. Overdosage: If you think you have taken too much of this medicine contact a poison control center or emergency room at once. NOTE: This medicine is only for you. Do not share this medicine with others. What if I miss a dose? It is important not to miss any doses. Talk to your care team about what to do if you miss a dose. What may interact with this medication? Interactions are not expected. This list may not describe all possible interactions. Give your health care provider a list of all the medicines, herbs, non-prescription drugs, or dietary supplements you use. Also tell them if you smoke, drink alcohol, or use illegal drugs. Some items may interact with your medicine. What should I watch for while using this medication? Visit your care team for regular checks on your progress. Tell your care team if your symptoms do not start to get better or if they get worse. You may need blood work while you  are taking this medication. Do not wear the on-body infuser during an MRI. Taking this medication is only part of a total heart healthy program. Ask your care team if there are other changes you can make to improve your overall health. What side effects may I notice from receiving this medication? Side effects that you should report to your care team as soon as possible: Allergic reactions or angioedema--skin rash, itching or hives, swelling of the  face, eyes, lips, tongue, arms, or legs, trouble swallowing or breathing Side effects that usually do not require medical attention (report to your care team if they continue or are bothersome): Back pain Flu-like symptoms--fever, chills, muscle pain, cough, headache, fatigue Pain, redness, or irritation at injection site Runny or stuffy nose Sore throat This list may not describe all possible side effects. Call your doctor for medical advice about side effects. You may report side effects to FDA at 1-800-FDA-1088. Where should I keep my medication? Keep out of the reach of children and pets. Store in a refrigerator or at room temperature between 20 and 25 degrees C (68 and 77 degrees F). Refrigeration (preferred): Store it in the refrigerator. Do not freeze. Keep it in the original carton until you are ready to take it. Remove the dose from the carton about 30 minutes before it is time for you to take it. Get rid of any unused medication after the expiration date. Room temperature: This medication may be stored at room temperature for up to 30 days. Keep it in the original carton until you are ready to take it. If it is stored at room temperature, get rid of any unused medication after 30 days or after it expires, whichever is first. Protect from light. Do not shake. Avoid exposure to extreme heat. To get rid of medications that are no longer needed or have expired: Take the medication to a medication take-back program. Check with your pharmacy or law enforcement to find a location. If you cannot return the medication, ask your pharmacist or care team how to get rid of this medication safely. NOTE: This sheet is a summary. It may not cover all possible information. If you have questions about this medicine, talk to your doctor, pharmacist, or health care provider.  2025 Elsevier/Gold Standard (2023-11-29 00:00:00)   Bempedoic Acid Tablets What is this medication? BEMPEDOIC ACID (BEM pe DOE  ik AS id) treats high cholesterol and reduces the risk of heart attack. It works by reducing the amount of cholesterol made by your body. Changes to diet and exercise are often combined with this medication. This medicine may be used for other purposes; ask your health care provider or pharmacist if you have questions. COMMON BRAND NAME(S): NEXLETOL What should I tell my care team before I take this medication? They need to know if you have any of these conditions: Gout Kidney problems Liver disease Tendon problems An unusual or allergic reaction to bempedoic acid, other medications, foods, dyes, or preservatives Pregnant or trying to become pregnant Breast-feeding How should I use this medication? Take this medication by mouth. Take it as directed on the prescription label at the same time every day. You can take it with or without food. If it upsets your stomach, take it with food. Keep taking it unless your care team tells you to stop. Talk to your care team about the use of this medication in children. Special care may be needed. Overdosage: If you think you have taken too much of this  medicine contact a poison control center or emergency room at once. NOTE: This medicine is only for you. Do not share this medicine with others. What if I miss a dose? If you miss a dose, take it as soon as you can. If it is almost time for your next dose, take only that dose. Do not take double or extra doses. What may interact with this medication? Pravastatin Simvastatin This list may not describe all possible interactions. Give your health care provider a list of all the medicines, herbs, non-prescription drugs, or dietary supplements you use. Also tell them if you smoke, drink alcohol, or use illegal drugs. Some items may interact with your medicine. What should I watch for while using this medication? Visit your care team for regular checks on your progress. Tell your care team if your symptoms do not  start to get better or if they get worse. Your care team may tell you to stop taking this medication if you develop muscle problems. If your muscle problems do not go away after stopping this medication, contact your care team. Taking this medication is only part of a total heart healthy program. Ask your care team if there are other changes you can make to improve your overall health. What side effects may I notice from receiving this medication? Side effects that you should report to your care team as soon as possible: Allergic reactions--skin rash, itching, hives, swelling of the face, lips, tongue, or throat High uric acid level--severe pain, redness, warmth, or swelling in joints, pain or trouble passing urine, pain in the lower back or sides Joint, muscle, or tendon pain, swelling, or stiffness Side effects that usually do not require medical attention (report to your care team if they continue or are bothersome): Diarrhea Muscle spasms Stomach pain This list may not describe all possible side effects. Call your doctor for medical advice about side effects. You may report side effects to FDA at 1-800-FDA-1088. Where should I keep my medication? Keep out of the reach of children and pets. Store between 15 and 30 degrees C (59 and 86 degrees F). Keep this medication in the original container. Protect from moisture. Keep the container tightly closed. Do not throw out the packet in the container. It keeps the medication dry. Get rid of any unused medication after the expiration date. To get rid of medications that are no longer needed or have expired: Take the medication to a medication take-back program. Check with your pharmacy or law enforcement to find a location. If you cannot return the medication, check the label or package insert to see if the medication should be thrown out in the trash or flushed down the toilet. If you are not sure, ask your care team. If it is safe to put it in the  trash, empty the medication out of the container. Mix the medication with cat litter, dirt, coffee grounds, or other unwanted substance. Seal the mixture in a bag or container. Put it in the trash. NOTE: This sheet is a summary. It may not cover all possible information. If you have questions about this medicine, talk to your doctor, pharmacist, or health care provider.  2024 Elsevier/Gold Standard (2023-09-24 00:   Ezetimibe Tablets What is this medication? EZETIMIBE (ez ET i mibe) treats high cholesterol. It works by reducing the amount of cholesterol absorbed from the food you eat. This decreases the amount of bad cholesterol (such as LDL) in your blood. Changes to diet and exercise are  often combined with this medication. This medicine may be used for other purposes; ask your health care provider or pharmacist if you have questions. COMMON BRAND NAME(S): Zetia What should I tell my care team before I take this medication? They need to know if you have any of these conditions: Kidney disease Liver disease Muscle cramps, pain Muscle injury Thyroid disease An unusual or allergic reaction to ezetimibe, other medications, foods, dyes, or preservatives Pregnant or trying to get pregnant Breast-feeding How should I use this medication? Take this medication by mouth. Take it as directed on the prescription label at the same time every day. You can take it with or without food. If it upsets your stomach, take it with food. Keep taking it unless your care team tells you to stop. Take bile acid sequestrants at a different time of day than this medication. Take this medication 2 hours BEFORE or 4 hours AFTER bile acid sequestrants. Talk to your care team about the use of this medication in children. While it may be prescribed for children as young as 10 for selected conditions, precautions do apply. Overdosage: If you think you have taken too much of this medicine contact a poison control center or  emergency room at once. NOTE: This medicine is only for you. Do not share this medicine with others. What if I miss a dose? If you miss a dose, take it as soon as you can. If it is almost time for your next dose, take only that dose. Do not take double or extra doses. What may interact with this medication? Do not take this medication with any of the following: Fenofibrate Gemfibrozil This medication may also interact with the following: Antacids Cyclosporine Herbal medications like red yeast rice Other medications to lower cholesterol or triglycerides This list may not describe all possible interactions. Give your health care provider a list of all the medicines, herbs, non-prescription drugs, or dietary supplements you use. Also tell them if you smoke, drink alcohol, or use illegal drugs. Some items may interact with your medicine. What should I watch for while using this medication? Visit your care team for regular checks on your progress. Tell your care team if your symptoms do not start to get better or if they get worse. Your care team may tell you to stop taking this medication if you develop muscle problems. If your muscle problems do not go away after stopping this medication, contact your care team. Talk to your care team if you wish to become pregnant or think you might be pregnant. This medication can cause serious birth defects if taken during pregnancy. Talk to your care team before breastfeeding. Changes to your treatment plan may be needed. Taking this medication is only part of a total heart healthy program. Ask your care team if there are other changes you can make to improve your overall health. What side effects may I notice from receiving this medication? Side effects that you should report to your doctor or health care provider as soon as possible: Allergic reactions--skin rash, itching or hives, swelling of the face, lips, tongue, or throat Side effects that usually do  not require medical attention (report to your doctor or health care provider if they continue or are bothersome): Diarrhea Joint pain This list may not describe all possible side effects. Call your doctor for medical advice about side effects. You may report side effects to FDA at 1-800-FDA-1088. Where should I keep my medication? Keep out of the reach  of children and pets. Store at room temperature between 15 and 30 degrees C (59 and 86 degrees F). Protect from moisture. Get rid of any unused medication after the expiration date. NOTE: This sheet is a summary. It may not cover all possible information. If you have questions about this medicine, talk to your doctor, pharmacist, or health care provider.  2024 Elsevier/Gold Standard (2022-07-21 00:00:00)  Inclisiran Injection What is this medication? INCLISIRAN (in kli SIR an) treats high cholesterol. It works by decreasing bad cholesterol (such as LDL) in your blood. Changes to diet and exercise are often combined with this medication. This medicine may be used for other purposes; ask your health care provider or pharmacist if you have questions. COMMON BRAND NAME(S): LEQVIO What should I tell my care team before I take this medication? They need to know if you have any of these conditions: An unusual or allergic reaction to inclisiran, other medications, foods, dyes, or preservatives Pregnant or trying to get pregnant Breast-feeding How should I use this medication? This medication is injected under the skin. It is given by your care team in a hospital or clinic setting. Talk to your care team about the use of this medication in children. Special care may be needed. Overdosage: If you think you have taken too much of this medicine contact a poison control center or emergency room at once. NOTE: This medicine is only for you. Do not share this medicine with others. What if I miss a dose? Keep appointments for follow-up doses. It is  important not to miss your dose. Call your care team if you are unable to keep an appointment. What may interact with this medication? Interactions are not expected. This list may not describe all possible interactions. Give your health care provider a list of all the medicines, herbs, non-prescription drugs, or dietary supplements you use. Also tell them if you smoke, drink alcohol, or use illegal drugs. Some items may interact with your medicine. What should I watch for while using this medication? Visit your care team for regular checks on your progress. Tell your care team if your symptoms do not start to get better or if they get worse. You may need blood work while you are taking this medication. What side effects may I notice from receiving this medication? Side effects that you should report to your care team as soon as possible: Allergic reactions--skin rash, itching, hives, swelling of the face, lips, tongue, or throat Side effects that usually do not require medical attention (report these to your care team if they continue or are bothersome): Joint pain Pain, redness, or irritation at injection site This list may not describe all possible side effects. Call your doctor for medical advice about side effects. You may report side effects to FDA at 1-800-FDA-1088. Where should I keep my medication? This medication is given in a hospital or clinic. It will not be stored at home. NOTE: This sheet is a summary. It may not cover all possible information. If you have questions about this medicine, talk to your doctor, pharmacist, or health care provider.  2024 Elsevier/Gold Standard (2023-09-24 00:00:00)

## 2024-09-13 NOTE — Progress Notes (Unsigned)
 LIPID CLINIC CONSULT NOTE  Chief Complaint:  Manage dyslipidemia  Primary Care Physician: Georgina Speaks, FNP  Primary Cardiologist:  None  HPI:  Olivia Wall is a 48 y.o. female who is being seen today for the evaluation of dyslipidemia at the request of Gregg Lek, MD. this is a pleasant 49 year old female kindly referred for evaluation management of dyslipidemia notably only to be mildly elevated with total cholesterol 179, triglycerides 186, HDL 40 and LDL 102.  She has not been on any lipid-lowering therapies.  She has been on low-dose aspirin  however because of a recent episode of TIA.  Initially was thought that she had a lacunar stroke however MRI was normal despite an abnormal head CT.  Based on this her target LDL should be less than 70.  Lipid-lowering therapy would therefore be helpful.  PMHx:  Past Medical History:  Diagnosis Date   Abnormal Pap smear of cervix    Cancer (HCC)    Chicken pox    Diabetes mellitus without complication (HCC)    Hyperlipidemia    Ovarian cyst    Stroke (HCC) 2025    Past Surgical History:  Procedure Laterality Date   ABDOMINAL HYSTERECTOMY     CESAREAN SECTION  1994   COSMETIC SURGERY  1/21   tummy tuck   LAPAROSCOPIC BILATERAL SALPINGECTOMY Bilateral 05/07/2016   Procedure: LAPAROSCOPIC BILATERAL SALPINGECTOMY;  Surgeon: Mitzie BROCKS Ward, MD;  Location: [facility] ORS;  Service: Gynecology;  Laterality: Bilateral;   LAPAROSCOPIC HYSTERECTOMY N/A 05/07/2016   Procedure: HYSTERECTOMY TOTAL LAPAROSCOPIC;  Surgeon: Mitzie BROCKS Ward, MD;  Location: [facility] OR[facility];  Service: Gynecology;  Laterality: N/A;   LEEP N/A 03/13/2016   Procedure: LOOP ELECTROSURGICAL EXCISION PROCEDURE (LEEP);  Surgeon: Mitzie BROCKS Ward, MD;  Location: [facility] OR[facility];  Service: Gynecology;  Laterality: N/A;   SHOULDER SURGERY Right 2014    FAMHx:  Family History  Problem Relation Age of Onset   Cancer Mother    Multiple sclerosis Mother    Breast cancer Paternal Aunt         <50   Diabetes Maternal Grandmother    Cancer Paternal Grandmother    COPD Paternal Grandfather    Breast cancer Cousin        first cousin <50    SOCHx:   reports that she quit smoking about 12 years ago. Her smoking use included cigarettes. She started smoking about 27 years ago. She has a 7.5 pack-year smoking history. She has been exposed to tobacco smoke. She has never used smokeless tobacco. She reports that she does not drink alcohol and does not use drugs.  ALLERGIES:  No Known Allergies  ROS: Pertinent items noted in HPI and remainder of comprehensive ROS otherwise negative.  HOME MEDS: Current Outpatient Medications on File Prior to Visit  Medication Sig Dispense Refill   aspirin  81 MG chewable tablet Chew 1 tablet (81 mg total) by mouth daily. 30 tablet 12   Semaglutide , 2 MG/DOSE, (OZEMPIC , 2 MG/DOSE,) 8 MG/3ML SOPN Inject 2 mg into the skin once a week. (Patient taking differently: Inject 2 mg into the skin once a week. On Saturday) 9 mL 1   Vitamin D , Ergocalciferol , (DRISDOL ) 1.25 MG (50000 UNIT) CAPS capsule TAKE 1 CAPSULE BY MOUTH TWICE  WEEKLY 26 capsule 3   No current facility-administered medications on file prior to visit.    LABS/IMAGING: No results found for this or any previous visit (from the past 48 hours). No results found.  LIPID PANEL:  Component Value Date/Time   CHOL 179 07/16/2024 1112   CHOL 199 06/22/2024 1229   TRIG 186 (H) 07/16/2024 1112   HDL 40 (L) 07/16/2024 1112   HDL 37 (L) 06/22/2024 1229   CHOLHDL 4.5 07/16/2024 1112   VLDL 37 07/16/2024 1112   LDLCALC 102 (H) 07/16/2024 1112   LDLCALC 113 (H) 06/22/2024 1229    No results found for: LIPOA   WEIGHTS: Wt Readings from Last 3 Encounters:  09/13/24 188 lb 12.8 oz (85.6 kg)  08/10/24 191 lb (86.6 kg)  07/20/24 189 lb 6.4 oz (85.9 kg)    VITALS: BP 124/62 (BP Location: Right Arm, Patient Position: Sitting, Cuff Size: Large)   Pulse 67   Ht 5' 6 (1.676 m)   Wt  188 lb 12.8 oz (85.6 kg)   LMP 03/05/2013   SpO2 99%   BMI 30.47 kg/m   EXAM: Deferred  EKG: Deferred  ASSESSMENT: History of stroke Dyslipidemia, goal LDL less than 70  PLAN: 1.   [name] he has a history of stroke recently and dyslipidemia with elevated triglycerides and LDL cholesterol above target.  She should be on lipid-lowering therapy but is hesitant to do that.  She also thinks that her labs may have improved somewhat after recent dietary lifestyle changes.  Will plan to repeat her lipids today including an NMR and LP(a).  If she is above target would advise starting statin therapy that was appropriate to get her to target.  Thanks again for the kind referral.  Vinie KYM Maxcy, MD, [facility], FNLA, FACP  [place]  [facility]  Medical Director of the [facility]  [facility] Diplomate of the American Board of Clinical Lipidology Attending Cardiologist  Direct Dial: [phone]  Fax: [phone]  Website:  [email or web address] [name] 09/13/2024, 1:53 PM

## 2024-09-20 LAB — NMR, LIPOPROFILE
Cholesterol, Total: 196 mg/dL (ref 100–199)
HDL Particle Number: 31 umol/L (ref >=30.5)
HDL-C: 40 mg/dL (ref >39)
LDL Particle Number: 1743 nmol/L — ABNORMAL HIGH (ref <1000)
LDL Size: 20.7 nm (ref >20.5)
LDL-C (NIH Calc): 130 mg/dL — ABNORMAL HIGH (ref 0–99)
LP-IR Score: 56 — ABNORMAL HIGH (ref <=45)
Small LDL Particle Number: 979 nmol/L — ABNORMAL HIGH (ref <=527)
Triglycerides: 146 mg/dL (ref 0–149)

## 2024-09-20 LAB — LIPOPROTEIN A (LPA): Lipoprotein (a): 44.6 nmol/L (ref <75.0)

## 2024-09-25 ENCOUNTER — Ambulatory Visit: Payer: Self-pay | Admitting: Internal Medicine

## 2024-09-25 DIAGNOSIS — E785 Hyperlipidemia, unspecified: Secondary | ICD-10-CM

## 2024-09-26 ENCOUNTER — Telehealth: Payer: Self-pay | Admitting: Pharmacy Technician

## 2024-09-26 ENCOUNTER — Other Ambulatory Visit (HOSPITAL_COMMUNITY): Payer: Self-pay

## 2024-09-26 MED ORDER — REPATHA SURECLICK 140 MG/ML ~~LOC~~ SOAJ: 140.0000 mg | SUBCUTANEOUS | 3 refills | Status: DC

## 2024-09-26 NOTE — Telephone Encounter (Signed)
 Pharmacy Patient Advocate Encounter   Received notification from Physician's Office that prior authorization for Repatha is required/requested.   Insurance verification completed.   The patient is insured through [employer].   Per test claim: The current 09/26/24 day co-pay is, $75.00- one month.  No PA needed at this time. This test claim was processed through [facility]- copay amounts may vary at other pharmacies due to pharmacy/plan contracts, or as the patient moves through the different stages of their insurance plan.

## 2024-09-26 NOTE — Telephone Encounter (Signed)
 Waiting on prescription card

## 2024-10-03 ENCOUNTER — Ambulatory Visit: Payer: Self-pay | Admitting: Family Medicine

## 2024-10-03 VITALS — BP 118/64 | HR 87 | Temp 98.1°F | Ht 66.0 in | Wt 190.0 lb

## 2024-10-03 DIAGNOSIS — E1149 Type 2 diabetes mellitus with other diabetic neurological complication: Secondary | ICD-10-CM | POA: Diagnosis not present

## 2024-10-03 DIAGNOSIS — Z6830 Body mass index (BMI) 30.0-30.9, adult: Secondary | ICD-10-CM | POA: Diagnosis not present

## 2024-10-03 DIAGNOSIS — G720 Drug-induced myopathy: Secondary | ICD-10-CM | POA: Diagnosis not present

## 2024-10-03 DIAGNOSIS — E66811 Obesity, class 1: Secondary | ICD-10-CM | POA: Diagnosis not present

## 2024-10-03 DIAGNOSIS — E785 Hyperlipidemia, unspecified: Secondary | ICD-10-CM | POA: Diagnosis not present

## 2024-10-03 DIAGNOSIS — T466X5A Adverse effect of antihyperlipidemic and antiarteriosclerotic drugs, initial encounter: Secondary | ICD-10-CM | POA: Diagnosis not present

## 2024-10-03 DIAGNOSIS — E6609 Other obesity due to excess calories: Secondary | ICD-10-CM | POA: Diagnosis not present

## 2024-10-03 DIAGNOSIS — Z8673 Personal history of transient ischemic attack (TIA), and cerebral infarction without residual deficits: Secondary | ICD-10-CM

## 2024-10-03 DIAGNOSIS — E119 Type 2 diabetes mellitus without complications: Secondary | ICD-10-CM | POA: Insufficient documentation

## 2024-10-03 DIAGNOSIS — E1169 Type 2 diabetes mellitus with other specified complication: Secondary | ICD-10-CM | POA: Diagnosis not present

## 2024-10-03 NOTE — Patient Instructions (Signed)

## 2024-10-03 NOTE — Assessment & Plan Note (Signed)
 She is encouraged to strive for BMI less than 30 to decrease cardiac risk. Advised to aim for at least 150 minutes of exercise per week.

## 2024-10-03 NOTE — Assessment & Plan Note (Signed)
-   Continue Repatha injection every 2 weeks

## 2024-10-03 NOTE — Assessment & Plan Note (Signed)
 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 Repatha  initiated with mild congestion as a side effect. - Continue Repatha .

## 2024-10-03 NOTE — Assessment & Plan Note (Signed)
 Type 2 diabetes managed with Ozempic . - Continue Ozempic  once weekly. Had A1c checked while in hospital, too early to check today.
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
## 2024-10-03 NOTE — Assessment & Plan Note (Signed)
 Ischemic stroke in September with no residual deficits.  - Continue current management and monitoring.

## 2024-10-04 LAB — CBC
Hematocrit: 36.5 % (ref 34.0–46.6)
Hemoglobin: 12.4 g/dL (ref 11.1–15.9)
MCH: 33.7 pg — ABNORMAL HIGH (ref 26.6–33.0)
MCHC: 34 g/dL (ref 31.5–35.7)
MCV: 99 fL — ABNORMAL HIGH (ref 79–97)
Platelets: 341 x10E3/uL (ref 150–450)
RBC: 3.68 x10E6/uL — ABNORMAL LOW (ref 3.77–5.28)
RDW: 11.1 % — ABNORMAL LOW (ref 11.7–15.4)
WBC: 9.5 x10E3/uL (ref 3.4–10.8)

## 2024-10-04 LAB — LIPID PANEL
Chol/HDL Ratio: 2.6 ratio (ref 0.0–4.4)
Cholesterol, Total: 105 mg/dL (ref 100–199)
HDL: 41 mg/dL (ref >39)
LDL Chol Calc (NIH): 26 mg/dL (ref 0–99)
Triglycerides: 250 mg/dL — ABNORMAL HIGH (ref 0–149)
VLDL Cholesterol Cal: 38 mg/dL (ref 5–40)

## 2024-10-04 LAB — CMP14+EGFR
ALT: 18 IU/L (ref 0–32)
AST: 16 IU/L (ref 0–40)
Albumin: 4.3 g/dL (ref 3.9–4.9)
Alkaline Phosphatase: 92 IU/L (ref 41–116)
BUN/Creatinine Ratio: 11 (ref 9–23)
BUN: 10 mg/dL (ref 6–24)
Bilirubin Total: 0.3 mg/dL (ref 0.0–1.2)
CO2: 24 mmol/L (ref 20–29)
Calcium: 9.1 mg/dL (ref 8.7–10.2)
Chloride: 101 mmol/L (ref 96–106)
Creatinine, Ser: 0.94 mg/dL (ref 0.57–1.00)
Globulin, Total: 3.1 g/dL (ref 1.5–4.5)
Glucose: 123 mg/dL — ABNORMAL HIGH (ref 70–99)
Potassium: 4.5 mmol/L (ref 3.5–5.2)
Sodium: 137 mmol/L (ref 134–144)
Total Protein: 7.4 g/dL (ref 6.0–8.5)
eGFR: 76 mL/min/1.73 (ref >59)

## 2024-10-05 ENCOUNTER — Ambulatory Visit: Admitting: Nurse Practitioner

## 2024-10-22 ENCOUNTER — Other Ambulatory Visit: Payer: Self-pay | Admitting: Nurse Practitioner

## 2024-10-22 DIAGNOSIS — E1169 Type 2 diabetes mellitus with other specified complication: Secondary | ICD-10-CM

## 2024-11-10 ENCOUNTER — Other Ambulatory Visit: Payer: Self-pay | Admitting: Pharmacist

## 2024-11-10 MED ORDER — REPATHA SURECLICK 140 MG/ML ~~LOC~~ SOAJ: 140.0000 mg | SUBCUTANEOUS | 3 refills | Status: AC

## 2024-11-10 NOTE — Telephone Encounter (Signed)
 Fax request for Repatha  refill received from [facility], prescription sent

## 2024-11-15 ENCOUNTER — Other Ambulatory Visit: Payer: Self-pay

## 2024-11-15 ENCOUNTER — Encounter: Payer: Self-pay | Admitting: Nurse Practitioner

## 2024-11-15 MED ORDER — ASPIRIN 81 MG PO CHEW: 81.0000 mg | CHEWABLE_TABLET | Freq: Every day | ORAL | 1 refills | Status: AC

## 2024-12-28 ENCOUNTER — Encounter: Payer: Self-pay | Admitting: Nurse Practitioner
